# Patient Record
Sex: Female | Born: 1985
Health system: Southern US, Community
[De-identification: ages and names within clinical notes are randomized; demographics above are authoritative.]

## PROBLEM LIST (undated history)

## (undated) DIAGNOSIS — I959 Hypotension, unspecified: Secondary | ICD-10-CM

## (undated) DIAGNOSIS — M5137 Other intervertebral disc degeneration, lumbosacral region: Secondary | ICD-10-CM

## (undated) DIAGNOSIS — M51379 Other intervertebral disc degeneration, lumbosacral region without mention of lumbar back pain or lower extremity pain: Secondary | ICD-10-CM

## (undated) DIAGNOSIS — M5410 Radiculopathy, site unspecified: Secondary | ICD-10-CM

## (undated) HISTORY — DX: Radiculopathy, site unspecified: M54.10

## (undated) HISTORY — PX: TONSILLECTOMY: SUR1361

## (undated) HISTORY — PX: OTHER SURGICAL HISTORY: SHX169

## (undated) HISTORY — DX: Other intervertebral disc degeneration, lumbosacral region without mention of lumbar back pain or lower extremity pain: M51.379

## (undated) HISTORY — DX: Other intervertebral disc degeneration, lumbosacral region: M51.37

---

## 2012-02-04 ENCOUNTER — Encounter: Payer: Self-pay | Admitting: Family Medicine

## 2012-02-04 ENCOUNTER — Ambulatory Visit (INDEPENDENT_AMBULATORY_CARE_PROVIDER_SITE_OTHER): Payer: 59 | Admitting: Family Medicine

## 2012-02-04 VITALS — BP 105/64 | HR 89

## 2012-02-04 DIAGNOSIS — H60399 Other infective otitis externa, unspecified ear: Secondary | ICD-10-CM

## 2012-02-04 DIAGNOSIS — H6090 Unspecified otitis externa, unspecified ear: Secondary | ICD-10-CM

## 2012-02-04 MED ORDER — CIPROFLOXACIN-DEXAMETHASONE 0.3-0.1 % OT SUSP: 4.0000 [drp] | Freq: Two times a day (BID) | OTIC | Status: DC

## 2012-02-04 MED ORDER — SULFAMETHOXAZOLE-TRIMETHOPRIM 800-160 MG PO TABS: ORAL_TABLET | ORAL | Status: DC

## 2012-02-04 NOTE — Progress Notes (Signed)
CC: Kimberly Knight is a 26 y.o. female is here for Mass   Subjective: HPI:  Patient presents to establish care past medical history significant for Pap smear August 2011 without abnormalities. Tetanus in 2012. Surgical history significant for hemangioma removal 1995  Patient concerned of right ear pain is been present since mid last week. The past 3 days she's been using acetic acid/hydrocortisone otic without much improvement of the pain. Pain is nonradiating, worse when wearing her stethoscope, nothing else makes it better or worse. No discharge from the ear, hearing loss, nor dizziness  Review of Systems - General ROS: negative for - chills, fever, night sweats, weight gain or weight loss Ophthalmic ROS: negative for - decreased vision Psychological ROS: negative for - anxiety or depression ENT ROS: negative for - hearing change, nasal congestion, tinnitus or allergies Hematological and Lymphatic ROS: negative for - bleeding problems, bruising or swollen lymph nodes Breast ROS: negative Respiratory ROS: no cough, shortness of breath, or wheezing Cardiovascular ROS: no chest pain or dyspnea on exertion Gastrointestinal ROS: no abdominal pain, change in bowel habits, or black or bloody stools Genito-Urinary ROS: negative for - genital discharge, genital ulcers, incontinence or abnormal bleeding from genitals Musculoskeletal ROS: negative for - joint pain or muscle pain Neurological ROS: negative for - headaches or memory loss Dermatological ROS: negative for lumps, mole changes, rash and skin lesion changes  History reviewed. No pertinent past medical history.   Family History  Problem Relation Age of Onset  . Cancer Mother     breast, skin  . Cancer Father     Pancreatic   . Hypertension Father      History  Substance Use Topics  . Smoking status: Never Smoker   . Smokeless tobacco: Never Used  . Alcohol Use: Yes     Comment: rare     Objective: Filed Vitals:   02/04/12  1400  BP: 105/64  Pulse: 89    General: Alert and Oriented, No Acute Distress HEENT: Pupils equal, round, reactive to light. Conjunctivae clear.  There is a half centimeter dome-shaped tender erythematous mass around 6:00 of the distal external canal on her right, TMs with appropriate landmarks.  Middle ear appears open without effusion. Pink inferior turbinates.  Moist mucous membranes, pharynx without inflammation nor lesions.  Neck supple without palpable lymphadenopathy nor abnormal masses. Lungs: Comfortable work of breathing, no accessory muscle use  Skin: Warm and dry.  Assessment & Plan: Kimberly Knight was seen today for mass.  Diagnoses and associated orders for this visit:  Otitis externa - ciprofloxacin-dexamethasone (CIPRODEX) otic suspension; Place 4 drops into the right ear 2 (two) times daily. - sulfamethoxazole-trimethoprim (SEPTRA DS) 800-160 MG per tablet; One by mouth twice a day for ten days.     Return if symptoms worsen or fail to improve.

## 2012-02-22 ENCOUNTER — Telehealth: Payer: Self-pay | Admitting: Family Medicine

## 2012-02-22 DIAGNOSIS — H9209 Otalgia, unspecified ear: Secondary | ICD-10-CM

## 2012-02-22 NOTE — Telephone Encounter (Signed)
Per patient request

## 2012-07-10 ENCOUNTER — Telehealth: Payer: Self-pay | Admitting: Family Medicine

## 2012-07-10 ENCOUNTER — Ambulatory Visit (INDEPENDENT_AMBULATORY_CARE_PROVIDER_SITE_OTHER): Payer: 59 | Admitting: Family Medicine

## 2012-07-10 ENCOUNTER — Encounter: Payer: Self-pay | Admitting: Family Medicine

## 2012-07-10 VITALS — BP 99/77 | Ht 73.0 in | Wt 177.0 lb

## 2012-07-10 DIAGNOSIS — Z130 Encounter for screening for diseases of the blood and blood-forming organs and certain disorders involving the immune mechanism: Secondary | ICD-10-CM

## 2012-07-10 DIAGNOSIS — Z13220 Encounter for screening for lipoid disorders: Secondary | ICD-10-CM

## 2012-07-10 DIAGNOSIS — N920 Excessive and frequent menstruation with regular cycle: Secondary | ICD-10-CM

## 2012-07-10 DIAGNOSIS — Z131 Encounter for screening for diabetes mellitus: Secondary | ICD-10-CM

## 2012-07-10 DIAGNOSIS — J029 Acute pharyngitis, unspecified: Secondary | ICD-10-CM

## 2012-07-10 MED ORDER — PENICILLIN V POTASSIUM 500 MG PO TABS: ORAL_TABLET | ORAL | Status: DC

## 2012-07-10 NOTE — Telephone Encounter (Signed)
Leaving for [employer] , daughter iwht strep and now having some sore throat, will take Rx incase symptoms progress out of country.

## 2012-07-10 NOTE — Progress Notes (Signed)
CC: Kimberly Knight is a 27 y.o. female is here for Hyperlipidemia   Subjective: HPI:  Patient presents to discuss routine blood work.  She reports a history of heavy menses prior and since delivery of her-31-year-old daughter. Denies swollen lymph nodes, bruising, night sweats, shortness of breath or fatigue.  Reports prior history of hair falling out at that time TSH was normal. Currently denies fatigue or signs of hyper or hypothyroidism. Hair loss resolved on its own. No unintentional weight gain or loss  Has never had cholesterol screening. Tries to eat healthy diet, regular exercise regimen training for half marathon. She's a nonsmoker no history elevated blood pressures.  Has never been screened for diabetes or renal insufficiency.    Review Of Systems Outlined In HPI  History reviewed. No pertinent past medical history.   Family History  Problem Relation Age of Onset  . Cancer Mother     breast, skin  . Cancer Father     Pancreatic   . Hypertension Father      History  Substance Use Topics  . Smoking status: Never Smoker   . Smokeless tobacco: Never Used  . Alcohol Use: Yes     Comment: rare     Objective: Filed Vitals:   07/10/12 0812  BP: 99/77    Vital signs reviewed. General: Alert and Oriented, No Acute Distress HEENT: Pupils equal, round, reactive to light. Conjunctivae clear.  External ears unremarkable.  Moist mucous membranes. Lungs: Clear and comfortable work of breathing, speaking in full sentences without accessory muscle use. Cardiac: Regular rate and rhythm.  Neuro: CN II-XII grossly intact, gait normal. Extremities: No peripheral edema.  Strong peripheral pulses.  Mental Status: No depression, anxiety, nor agitation. Logical though process. Skin: Warm and dry.  Assessment & Plan: Satonya was seen today for hyperlipidemia.  Diagnoses and associated orders for this visit:  Lipid screening - Lipid panel  Screening for diabetes  mellitus - COMPLETE METABOLIC PANEL WITH GFR  Screening for deficiency anemia - CBC  Heavy menses - CBC    Discussed [place] PTF guidelines with routine screening labs above will add CBC due to history of heavy menses to rule out anemia or thrombocytopenia.  Patient will be leaving for [place] on Friday, requesting results to be left in her office.  15 minutes spent face-to-face during visit today of which at least 50% was counseling or coordinating care lipid screening, screen diabetes, screen deficiency anemia, heavy menses    Return if symptoms worsen or fail to improve.

## 2012-07-11 ENCOUNTER — Encounter: Payer: Self-pay | Admitting: Family Medicine

## 2012-07-11 LAB — COMPLETE METABOLIC PANEL WITH GFR
ALT: 8 U/L (ref 0–35)
Alkaline Phosphatase: 50 U/L (ref 39–117)
CO2: 25 mEq/L (ref 19–32)
Creat: 0.8 mg/dL (ref 0.50–1.10)
GFR, Est African American: >89 mL/min
GFR, Est Non African American: >89 mL/min
Glucose, Bld: 79 mg/dL (ref 70–99)
Sodium: 139 mEq/L (ref 135–145)
Total Bilirubin: 0.7 mg/dL (ref 0.3–1.2)
Total Protein: 7.3 g/dL (ref 6.0–8.3)

## 2012-07-11 LAB — CBC
HCT: 38.8 % (ref 36.0–46.0)
Hemoglobin: 13.2 g/dL (ref 12.0–15.0)
RBC: 4.48 MIL/uL (ref 3.87–5.11)

## 2012-07-11 LAB — LIPID PANEL
HDL: 47 mg/dL (ref >39)
LDL Cholesterol: 95 mg/dL (ref 0–99)
Total CHOL/HDL Ratio: 3.6 Ratio
Triglycerides: 142 mg/dL (ref <150)

## 2012-10-14 ENCOUNTER — Encounter: Payer: Self-pay | Admitting: Family Medicine

## 2012-10-14 ENCOUNTER — Ambulatory Visit (INDEPENDENT_AMBULATORY_CARE_PROVIDER_SITE_OTHER): Payer: 59 | Admitting: Family Medicine

## 2012-10-14 VITALS — BP 107/76 | HR 83 | Wt 173.0 lb

## 2012-10-14 DIAGNOSIS — IMO0002 Reserved for concepts with insufficient information to code with codable children: Secondary | ICD-10-CM

## 2012-10-14 DIAGNOSIS — S76011A Strain of muscle, fascia and tendon of right hip, initial encounter: Secondary | ICD-10-CM

## 2012-10-14 MED ORDER — CYCLOBENZAPRINE HCL 10 MG PO TABS: ORAL_TABLET | ORAL | Status: DC

## 2012-10-14 MED ORDER — KETOROLAC TROMETHAMINE 60 MG/2ML IM SOLN
60.0000 mg | Freq: Once | INTRAMUSCULAR | Status: AC
  Administered 2012-10-14: 60 mg via INTRAMUSCULAR

## 2012-10-14 NOTE — Progress Notes (Signed)
CC: Kimberly Knight is a 27 y.o. female is here for Back Pain   Subjective: HPI:  Patient is a right low back pain that is localized to above the right buttock that radiates within the right buttock and just slightly through the proximal right hamstrings. Symptoms came on abruptly of moderate severity while playing basketball at her house after landing on her on her right foot with her leg in an extended position.  She continued to play however within minutes symptoms became severe and she had to sit down. She reports inability to stand up for 10-15 minutes due to severe reproduction of the pain however it was relieved to some degree by leaning forward or sitting down. She required the assistance of her husband with sitting up and walking, pain was then somewhat improved with massage, 2 Aleve and 20 minutes of direct heat in her shower. Symptoms have significantly improved however still moderate in severity. Pain is again reproduced with standing completely erect or with bearing all her weight on her right foot. Symptoms again are improved with leaning forward while sitting. She denies midline back pain, saddle paresthesia, or weakness in either extremity. She denies any history of back pain in the past.  No genitourinary complaints nor bowel complaints   Review Of Systems Outlined In HPI  No past medical history on file.   Family History  Problem Relation Age of Onset  . Cancer Mother     breast, skin  . Cancer Father     Pancreatic   . Hypertension Father      History  Substance Use Topics  . Smoking status: Never Smoker   . Smokeless tobacco: Never Used  . Alcohol Use: Yes     Comment: rare     Objective: Filed Vitals:   10/14/12 1338  BP: 107/76  Pulse: 83    General: Alert and Oriented, No Acute Distress HEENT: Pupils equal, round, reactive to light. Conjunctivae clear.   Lungs: Clear comfortable work of breathing Extremities: No peripheral edema.  Strong peripheral pulses.  Negative straight leg raise negative fadir/fader, negative femoral grind, negative log roll, she has full range of motion strength in the right lower extremity. L4 and S1 DTRs are two over four bilaterally with downgoing Babinski. Stork test is negative Back: No midline spinous process tenderness pain is reproduced palpating upper gluteus maximus on the right and SI joint  Mental Status: No depression, anxiety, nor agitation. Skin: Warm and dry.  Assessment & Plan: Kimberly Knight was seen today for back pain.  Diagnoses and associated orders for this visit:  Muscle strain of gluteal region, right, initial encounter - cyclobenzaprine (FLEXERIL) 10 MG tablet; Take a half to a full tab every 8-12 hours only as needed for muscle spasm, may cause sedation.    Discussed with patient my suspicion of gluteal strain along with SI joint ligamentous sprain, conservative treatment with relative rest Flexeril and as needed Aleve, received Toradol shot here today  Return if symptoms worsen or fail to improve.

## 2013-01-21 ENCOUNTER — Ambulatory Visit (INDEPENDENT_AMBULATORY_CARE_PROVIDER_SITE_OTHER): Payer: 59 | Admitting: Family Medicine

## 2013-01-21 ENCOUNTER — Encounter: Payer: Self-pay | Admitting: Family Medicine

## 2013-01-21 VITALS — BP 112/74 | HR 68 | Ht 73.0 in | Wt 177.0 lb

## 2013-01-21 DIAGNOSIS — Z0000 Encounter for general adult medical examination without abnormal findings: Secondary | ICD-10-CM

## 2013-01-21 NOTE — Patient Instructions (Signed)
Dr. Adamarys Shall's General Advice Following Your Complete Physical Exam  The Benefits of Regular Exercise: Unless you suffer from an uncontrolled cardiovascular condition, studies strongly suggest that regular exercise and physical activity will add to both the quality and length of your life.  The World Health Organization recommends 150 minutes of moderate intensity aerobic activity every week.  This is best split over 3-4 days a week, and can be as simple as a brisk walk for just over 35 minutes "most days of the week".  This type of exercise has been shown to lower LDL-Cholesterol, lower average blood sugars, lower blood pressure, lower cardiovascular disease risk, improve memory, and increase one's overall sense of wellbeing.  The addition of anaerobic (or "strength training") exercises offers additional benefits including but not limited to increased metabolism, prevention of osteoporosis, and improved overall cholesterol levels.  How Can I Strive For A Low-Fat Diet?: Current guidelines recommend that 25-35 percent of your daily energy (food) intake should come from fats.  One might ask how can this be achieved without having to dissect each meal on a daily basis?  Switch to skim or 1% milk instead of whole milk.  Focus on lean meats such as ground turkey, fresh fish, baked chicken, and lean cuts of beef as your source of dietary protein.  Consume less than 300mg/day of dietary cholesterol.  Limit trans fatty acid consumption primarily by limiting synthetic trans fats such as partially hydrogenated oils (Ex: fried fast foods).  Focus efforts on reducing your intake of "solid" fats (Ex: Butter).  Substitute olive or vegetable oil for solid fats where possible.  Moderation of Salt Intake: Provided you don't carry a diagnosis of congestive heart failure nor renal failure, I recommend a daily allowance of no more than 2300 mg of salt (sodium).  Keeping under this daily goal is associated with a  decreased risk of cardiovascular events, creeping above it can lead to elevated blood pressures and increases your risk of cardiovascular events.  Milligrams (mg) of salt is listed on all nutrition labels, and your daily intake can add up faster than you think.  Most canned and frozen dinners can pack in over half your daily salt allowance in one meal.    Lifestyle Health Risks: Certain lifestyle choices carry specific health risks.  As you may already know, tobacco use has been associated with increasing one's risk of cardiovascular disease, pulmonary disease, numerous cancers, among many other issues.  What you may not know is that there are medications and nicotine replacement strategies that can more than double your chances of successfully quitting.  I would be thrilled to help manage your quitting strategy if you currently use tobacco products.  When it comes to alcohol use, I've yet to find an "ideal" daily allowance.  Provided an individual does not have a medical condition that is exacerbated by alcohol consumption, general guidelines determine "safe drinking" as no more than two standard drinks for a man or no more than one standard drink for a female per day.  However, much debate still exists on whether any amount of alcohol consumption is technically "safe".  My general advice, keep alcohol consumption to a minimum for general health promotion.  If you or others believe that alcohol, tobacco, or recreational drug use is interfering with your life, I would be happy to provide confidential counseling regarding treatment options.  General "Over The Counter" Nutrition Advice: Postmenopausal women should aim for a daily calcium intake of 1200 mg, however a significant   portion of this might already be provided by diets including milk, yogurt, cheese, and other dairy products.  Vitamin D has been shown to help preserve bone density, prevent fatigue, and has even been shown to help reduce falls in the  elderly.  Ensuring a daily intake of 800 Units of Vitamin D is a good place to start to enjoy the above benefits, we can easily check your Vitamin D level to see if you'd potentially benefit from supplementation beyond 800 Units a day.  Folic Acid intake should be of particular concern to women of childbearing age.  Daily consumption of 400-800 mcg of Folic Acid is recommended to minimize the chance of spinal cord defects in a fetus should pregnancy occur.    For many adults, accidents still remain one of the most common culprits when it comes to cause of death.  Some of the simplest but most effective preventitive habits you can adopt include regular seatbelt use, proper helmet use, securing firearms, and regularly testing your smoke and carbon monoxide detectors.  Graiden [facility] DO [facility] [place] [address], [address] [place], [place] [address] Phone: [phone]  Breast Self-Awareness Practicing breast self-awareness may pick up problems early, prevent significant medical complications, and possibly save your life. By practicing breast self-awareness, you can become familiar with how your breasts look and feel and if your breasts are changing. This allows you to notice changes early. It can also offer you some reassurance that your breast health is good. One way to learn what is normal for your breasts and whether your breasts are changing is to do a breast self-exam. If you find a lump or something that was not present in the past, it is best to contact your caregiver right away. Other findings that should be evaluated by your caregiver include nipple discharge, especially if it is bloody; skin changes or reddening; areas where the skin seems to be pulled in (retracted); or new lumps and bumps. Breast pain is seldom associated with cancer (malignancy), but should also be evaluated by a caregiver. HOW TO PERFORM A BREAST SELF-EXAM The best time to examine your breasts is 5 7 days after  your menstrual period is over. During menstruation, the breasts are lumpier, and it may be more difficult to pick up changes. If you do not menstruate, have reached menopause, or had your uterus removed (hysterectomy), you should examine your breasts at regular intervals, such as monthly. If you are breastfeeding, examine your breasts after a feeding or after using a breast pump. Breast implants do not decrease the risk for lumps or tumors, so continue to perform breast self-exams as recommended. Talk to your caregiver about how to determine the difference between the implant and breast tissue. Also, talk about the amount of pressure you should use during the exam. Over time, you will become more familiar with the variations of your breasts and more comfortable with the exam. A breast self-exam requires you to remove all your clothes above the waist. 1. Look at your breasts and nipples. Stand in front of a mirror in a room with good lighting. With your hands on your hips, push your hands firmly downward. Look for a difference in shape, contour, and size from one breast to the other (asymmetry). Asymmetry includes puckers, dips, or bumps. Also, look for skin changes, such as reddened or scaly areas on the breasts. Look for nipple changes, such as discharge, dimpling, repositioning, or redness. 2. Carefully feel your breasts. This is  best done either in the shower or tub while using soapy water or when flat on your back. Place the arm (on the side of the breast you are examining) above your head. Use the pads (not the fingertips) of your three middle fingers on your opposite hand to feel your breasts. Start in the underarm area and use  inch (2 cm) overlapping circles to feel your breast. Use 3 different levels of pressure (light, medium, and firm pressure) at each circle before moving to the next circle. The light pressure is needed to feel the tissue closest to the skin. The medium pressure will help to feel  breast tissue a little deeper, while the firm pressure is needed to feel the tissue close to the ribs. Continue the overlapping circles, moving downward over the breast until you feel your ribs below your breast. Then, move one finger-width towards the center of the body. Continue to use the  inch (2 cm) overlapping circles to feel your breast as you move slowly up toward the collar bone (clavicle) near the base of the neck. Continue the up and down exam using all 3 pressures until you reach the middle of the chest. Do this with each breast, carefully feeling for lumps or changes. 3.  Keep a written record with breast changes or normal findings for each breast. By writing this information down, you do not need to depend only on memory for size, tenderness, or location. Write down where you are in your menstrual cycle, if you are still menstruating. Breast tissue can have some lumps or thick tissue. However, see your caregiver if you find anything that concerns you.  SEEK MEDICAL CARE IF:  You see a change in shape, contour, or size of your breasts or nipples.   You see skin changes, such as reddened or scaly areas on the breasts or nipples.   You have an unusual discharge from your nipples.   You feel a new lump or unusually thick areas.  Document Released: 02/12/2005 Document Revised: 01/30/2012 Document Reviewed: 05/30/2011 [facility] Patient Information 2014 [place], [place].

## 2013-01-21 NOTE — Progress Notes (Signed)
CC: Kimberly Knight is a 27 y.o. female is here for Annual Exam   Subjective: HPI:  Colonoscopy: No family history will begin routine screening age 25 Papsmear: Normal two years ago, due for repeat in one year Mammogram: Mother at age 59, BRCA negative, will be due for mammograms age 41  Influenza Vaccine: UTD from October Pneumovax: No current indication Td/Tdap: UTD from 2012 Zoster: (Start 29 yo)  No acute complaints today  Over the past 2 weeks have you been bothered by: - Little interest or pleasure in doing things: No - Feeling down depressed or hopeless: No  Occasional exercise, rare alcohol use no tobacco or recreational drug use.  Review of Systems - General ROS: negative for - chills, fever, night sweats, weight gain or weight loss Ophthalmic ROS: negative for - decreased vision Psychological ROS: negative for - anxiety or depression ENT ROS: negative for - hearing change, nasal congestion, tinnitus or allergies Hematological and Lymphatic ROS: negative for - bleeding problems, bruising or swollen lymph nodes Breast ROS: negative Respiratory ROS: no cough, shortness of breath, or wheezing Cardiovascular ROS: no chest pain or dyspnea on exertion Gastrointestinal ROS: no abdominal pain, change in bowel habits, or black or bloody stools Genito-Urinary ROS: negative for - genital discharge, genital ulcers, incontinence or abnormal bleeding from genitals Musculoskeletal ROS: negative for - joint pain or muscle pain Neurological ROS: negative for - headaches or memory loss Dermatological ROS: negative for lumps, mole changes, rash and skin lesion changes  History reviewed. No pertinent past medical history.   Family History  Problem Relation Age of Onset  . Cancer Mother     breast, skin  . Cancer Father     Pancreatic   . Hypertension Father      History  Substance Use Topics  . Smoking status: Never Smoker   . Smokeless tobacco: Never Used  . Alcohol Use: Yes      Comment: rare     Objective: Filed Vitals:   01/21/13 1605  BP: 112/74  Pulse: 68    General: No Acute Distress HEENT: Atraumatic, normocephalic, conjunctivae normal without scleral icterus.  No nasal discharge, hearing grossly intact, TMs with good landmarks bilaterally with no middle ear abnormalities, posterior pharynx clear without oral lesions. Neck: Supple, trachea midline, no cervical nor supraclavicular adenopathy. Pulmonary: Clear to auscultation bilaterally without wheezing, rhonchi, nor rales. Cardiac: Regular rate and rhythm.  No murmurs, rubs, nor gallops. No peripheral edema.  2+ peripheral pulses bilaterally. Abdomen: Bowel sounds normal.  No masses.  Non-tender without rebound.  Negative Murphy's sign. MSK: Grossly intact, no signs of weakness.  Full strength throughout upper and lower extremities.  Full ROM in upper and lower extremities.  No midline spinal tenderness. Neuro: Gait unremarkable, CN II-XII grossly intact.  C5-C6 Reflex 2/4 Bilaterally, L4 Reflex 2/4 Bilaterally.  Cerebellar function intact. Skin: No rashes. Wart left elbow Psych: Alert and oriented to person/place/time.  Thought process normal. No anxiety/depression.  Assessment & Plan: Kimberly Knight was seen today for annual exam.  Diagnoses and associated orders for this visit:  Annual physical exam    Healthy lifestyle interventions including but limited to regular exercise, a healthy low fat diet, moderation of salt intake, the dangers of tobacco/alcohol/recreational drug use, nutrition supplementation, and accident avoidance were discussed with the patient and a handout was provided for future reference.  Return in about 1 year (around 01/21/2014).

## 2013-01-26 ENCOUNTER — Encounter: Payer: 59 | Admitting: Family Medicine

## 2013-06-05 ENCOUNTER — Telehealth: Payer: Self-pay | Admitting: Family Medicine

## 2013-06-05 MED ORDER — TRIAMCINOLONE ACETONIDE 0.5 % EX CREA: 1.0000 "application " | TOPICAL_CREAM | Freq: Every day | CUTANEOUS | Status: DC | PRN

## 2013-06-05 NOTE — Telephone Encounter (Signed)
Patient came in today with a rash on her upper chest. Excessive sun exposure over the weekend. She now has fine erythematous papules and itching. Will send over a topical steroid cream for acute relief. Followup if not improving.

## 2013-08-06 ENCOUNTER — Encounter: Payer: Self-pay | Admitting: Obstetrics & Gynecology

## 2013-08-06 ENCOUNTER — Ambulatory Visit (INDEPENDENT_AMBULATORY_CARE_PROVIDER_SITE_OTHER): Payer: 59 | Admitting: Obstetrics & Gynecology

## 2013-08-06 VITALS — BP 123/78 | HR 93 | Resp 16 | Ht 73.0 in | Wt 177.0 lb

## 2013-08-06 DIAGNOSIS — Z124 Encounter for screening for malignant neoplasm of cervix: Secondary | ICD-10-CM

## 2013-08-06 DIAGNOSIS — Z30432 Encounter for removal of intrauterine contraceptive device: Secondary | ICD-10-CM

## 2013-08-06 DIAGNOSIS — Z0000 Encounter for general adult medical examination without abnormal findings: Secondary | ICD-10-CM

## 2013-08-06 DIAGNOSIS — Z01419 Encounter for gynecological examination (general) (routine) without abnormal findings: Secondary | ICD-10-CM

## 2013-08-06 NOTE — Progress Notes (Signed)
Subjective:    Kimberly Knight is a 28 y.o. female who presents for an annual exam. She would like her Paragard IUD removed as she would like another ("final") pregnancy. She has been on PNVs for a couple of months.  The patient has no complaints today. The patient is sexually active. GYN screening history: last pap: was normal. The patient wears seatbelts: yes. The patient participates in regular exercise: yes. Has the patient ever been transfused or tattooed?: no. The patient reports that there is not domestic violence in her life.   Menstrual History: OB History   Grav Para Term Preterm Abortions TAB SAB Ect Mult Living                  Menarche age: 32  Patient's last menstrual period was 07/10/2013.    The following portions of the patient's history were reviewed and updated as appropriate: allergies, current medications, past family history, past medical history, past social history, past surgical history and problem list.  Review of Systems A comprehensive review of systems was negative. Married for 9 years, denies dyspareunia, periods about 5 days and heavy, monthly.   Objective:    BP 123/78  Pulse 93  Resp 16  Ht 6\' 1"  (1.854 m)  Wt 177 lb (80.287 kg)  BMI 23.36 kg/m2  LMP 07/10/2013  General Appearance:    Alert, cooperative, no distress, appears stated age  Head:    Normocephalic, without obvious abnormality, atraumatic  Eyes:    PERRL, conjunctiva/corneas clear, EOM's intact, fundi    benign, both eyes  Ears:    Normal TM's and external ear canals, both ears  Nose:   Nares normal, septum midline, mucosa normal, no drainage    or sinus tenderness  Throat:   Lips, mucosa, and tongue normal; teeth and gums normal  Neck:   Supple, symmetrical, trachea midline, no adenopathy;    thyroid:  no enlargement/tenderness/nodules; no carotid   bruit or JVD  Back:     Symmetric, no curvature, ROM normal, no CVA tenderness  Lungs:     Clear to auscultation bilaterally,  respirations unlabored  Chest Wall:    No tenderness or deformity   Heart:    Regular rate and rhythm, S1 and S2 normal, no murmur, rub   or gallop  Breast Exam:    No tenderness, masses, or nipple abnormality  Abdomen:     Soft, non-tender, bowel sounds active all four quadrants,    no masses, no organomegaly  Genitalia:    Normal female without lesion, discharge or tenderness, NSSA, NT, mobile, normal adnexal exam. Paragard IUD easily removed and noted to be intact.     Extremities:   Extremities normal, atraumatic, no cyanosis or edema  Pulses:   2+ and symmetric all extremities  Skin:   Skin color, texture, turgor normal, no rashes or lesions  Lymph nodes:   Cervical, supraclavicular, and axillary nodes normal  Neurologic:   CNII-XII intact, normal strength, sensation and reflexes    throughout  .    Assessment:    Healthy female exam.    Plan:     Breast self exam technique reviewed and patient encouraged to perform self-exam monthly. Thin prep Pap smear.  IUD removed

## 2013-08-10 LAB — CYTOLOGY - PAP

## 2013-08-12 ENCOUNTER — Telehealth: Payer: Self-pay | Admitting: *Deleted

## 2013-08-12 NOTE — Telephone Encounter (Signed)
Pt notified of normal pap results. 

## 2013-11-30 ENCOUNTER — Ambulatory Visit (INDEPENDENT_AMBULATORY_CARE_PROVIDER_SITE_OTHER): Payer: 59 | Admitting: Advanced Practice Midwife

## 2013-11-30 ENCOUNTER — Encounter: Payer: Self-pay | Admitting: Advanced Practice Midwife

## 2013-11-30 VITALS — BP 130/85 | HR 97 | Wt 187.0 lb

## 2013-11-30 DIAGNOSIS — Z3491 Encounter for supervision of normal pregnancy, unspecified, first trimester: Secondary | ICD-10-CM

## 2013-11-30 DIAGNOSIS — N898 Other specified noninflammatory disorders of vagina: Secondary | ICD-10-CM

## 2013-11-30 DIAGNOSIS — Z3481 Encounter for supervision of other normal pregnancy, first trimester: Secondary | ICD-10-CM

## 2013-11-30 DIAGNOSIS — N9489 Other specified conditions associated with female genital organs and menstrual cycle: Secondary | ICD-10-CM

## 2013-11-30 DIAGNOSIS — O219 Vomiting of pregnancy, unspecified: Secondary | ICD-10-CM

## 2013-11-30 MED ORDER — DICLEGIS 10-10 MG PO TBEC: 2.0000 | DELAYED_RELEASE_TABLET | ORAL | Status: DC

## 2013-11-30 MED ORDER — METOCLOPRAMIDE HCL 10 MG PO TABS: 10.0000 mg | ORAL_TABLET | Freq: Four times a day (QID) | ORAL | Status: DC | PRN

## 2013-11-30 NOTE — Progress Notes (Signed)
   Subjective:    Kimberly Knight is a G3P1011 [redacted]w[redacted]d by LMP, verified by [place] today being seen today for her first obstetrical visit.  Her obstetrical history is significant for macrosomia. No shoulder dystocia.  Patient does intend to breast feed. Pregnancy history fully reviewed.  Patient reports nausea, vomiting and vaginal discharge w/ odor.  Filed Vitals:   11/30/13 0809  BP: 130/85  Pulse: 97  Weight: 187 lb (84.823 kg)    HISTORY: OB History  Gravida Para Term Preterm AB SAB TAB Ectopic Multiple Living  3 1 1  1 1    1     # Outcome Date GA Lbr Len/2nd Weight Sex Delivery Anes PTL Lv  3 CUR           2 TRM 09/01/10 [redacted]w[redacted]d  10 lb 8 oz (4.763 kg) F SVD EPI N Y     Comments: induction  1 SAB              History reviewed. No pertinent past medical history. Past Surgical History  Procedure Laterality Date  . Hemangioma removal 1995     Family History  Problem Relation Age of Onset  . Cancer Mother     breast, skin  . Cancer Father     Pancreatic   . Hypertension Father      Exam    Uterus:     Pelvic Exam:    Perineum: No Hemorrhoids   Vulva: normal   Vagina:  normal mucosa, wet prep done, mod creamy, white, mildly malodorous discharge   pH: NA   Cervix: multiparous appearance, no bleeding following Pap and ectropion   Adnexa: normal adnexa and no mass, fullness, tenderness   Bony Pelvis: gynecoid and proven to 10-6  System: Breast:  normal appearance, no masses or tenderness, ***   Skin: Declined    Neurologic: oriented, grossly non-focal   Extremities: normal strength, tone, and muscle mass   HEENT sclera clear, anicteric   Mouth/Teeth mucous membranes moist, pharynx normal without lesions and dental hygiene good   Neck supple and no masses   Cardiovascular: regular rate and rhythm, no murmurs or gallops   Respiratory:  neck free of mass or lymphadenopathy, chest clear, no wheezing, crepitations, rhonchi, normal symmetric air entry   Abdomen: soft,  non-tender; bowel sounds normal; no masses,  no organomegaly and Well-healed scar upper abd   Urinary: urethral meatus normal      Assessment:    Pregnancy: G3P1011 1. Normal pregnancy in first trimester *** - Prenatal (OB Panel) - HIV antibody (with reflex) - CULTURE, URINE COMPREHENSIVE - GC/Chlamydia Probe Amp  2. Supervision of normal pregnancy in first trimester ***  3. Vaginal odor *** - WET PREP FOR TRICH, YEAST, CLUE  4. Nausea and vomiting of pregnancy, antepartum *** - metoCLOPramide (REGLAN) 10 MG tablet; Take 1 tablet (10 mg total) by mouth every 6 (six) hours as needed for nausea.  Dispense: 60 tablet; Refill: 3 - DICLEGIS 10-10 MG TBEC; Take 2 tablets by mouth as directed.  Dispense: 100 tablet; Refill: 1       Plan:     Initial labs drawn. Prenatal vitamins. Problem list reviewed and updated. Genetic Screening discussed First Screen and Quad Screen: declined.  Ultrasound discussed; fetal survey: requested.  Follow up in 4 weeks. 50% of 30 min visit spent on counseling and coordination of care.  Needs early 1 hour GTT due to Hx macrosomia   Latresa Gasser 11/30/2013

## 2013-11-30 NOTE — Progress Notes (Signed)
Poss BV at this time.  Increase in nausea.  IUP with FHT and CRL 11.9 mm and GA [redacted]w[redacted]d

## 2013-11-30 NOTE — Patient Instructions (Signed)
First Trimester of Pregnancy The first trimester of pregnancy is from week 1 until the end of week 12 (months 1 through 3). A week after a sperm fertilizes an egg, the egg will implant on the wall of the uterus. This embryo will begin to develop into a baby. Genes from you and your partner are forming the baby. The female genes determine whether the baby is a boy or a girl. At 6-8 weeks, the eyes and face are formed, and the heartbeat can be seen on ultrasound. At the end of 12 weeks, all the baby's organs are formed.  Now that you are pregnant, you will want to do everything you can to have a healthy baby. Two of the most important things are to get good prenatal care and to follow your health care provider's instructions. Prenatal care is all the medical care you receive before the baby's birth. This care will help prevent, find, and treat any problems during the pregnancy and childbirth. BODY CHANGES Your body goes through many changes during pregnancy. The changes vary from woman to woman.   You may gain or lose a couple of pounds at first.  You may feel sick to your stomach (nauseous) and throw up (vomit). If the vomiting is uncontrollable, call your health care provider.  You may tire easily.  You may develop headaches that can be relieved by medicines approved by your health care provider.  You may urinate more often. Painful urination may mean you have a bladder infection.  You may develop heartburn as a result of your pregnancy.  You may develop constipation because certain hormones are causing the muscles that push waste through your intestines to slow down.  You may develop hemorrhoids or swollen, bulging veins (varicose veins).  Your breasts may begin to grow larger and become tender. Your nipples may stick out more, and the tissue that surrounds them (areola) may become darker.  Your gums may bleed and may be sensitive to brushing and flossing.  Dark spots or blotches (chloasma,  mask of pregnancy) may develop on your face. This will likely fade after the baby is born.  Your menstrual periods will stop.  You may have a loss of appetite.  You may develop cravings for certain kinds of food.  You may have changes in your emotions from day to day, such as being excited to be pregnant or being concerned that something may go wrong with the pregnancy and baby.  You may have more vivid and strange dreams.  You may have changes in your hair. These can include thickening of your hair, rapid growth, and changes in texture. Some women also have hair loss during or after pregnancy, or hair that feels dry or thin. Your hair will most likely return to normal after your baby is born. WHAT TO EXPECT AT YOUR PRENATAL VISITS During a routine prenatal visit:  You will be weighed to make sure you and the baby are growing normally.  Your blood pressure will be taken.  Your abdomen will be measured to track your baby's growth.  The fetal heartbeat will be listened to starting around week 10 or 12 of your pregnancy.  Test results from any previous visits will be discussed. Your health care provider may ask you:  How you are feeling.  If you are feeling the baby move.  If you have had any abnormal symptoms, such as leaking fluid, bleeding, severe headaches, or abdominal cramping.  If you have any questions. Other tests   that may be performed during your first trimester include:  Blood tests to find your blood type and to check for the presence of any previous infections. They will also be used to check for low iron levels (anemia) and Rh antibodies. Later in the pregnancy, blood tests for diabetes will be done along with other tests if problems develop.  Urine tests to check for infections, diabetes, or protein in the urine.  An ultrasound to confirm the proper growth and development of the baby.  An amniocentesis to check for possible genetic problems.  Fetal screens for  spina bifida and Down syndrome.  You may need other tests to make sure you and the baby are doing well. HOME CARE INSTRUCTIONS  Medicines  Follow your health care provider's instructions regarding medicine use. Specific medicines may be either safe or unsafe to take during pregnancy.  Take your prenatal vitamins as directed.  If you develop constipation, try taking a stool softener if your health care provider approves. Diet  Eat regular, well-balanced meals. Choose a variety of foods, such as meat or vegetable-based protein, fish, milk and low-fat dairy products, vegetables, fruits, and whole grain breads and cereals. Your health care provider will help you determine the amount of weight gain that is right for you.  Avoid raw meat and uncooked cheese. These carry germs that can cause birth defects in the baby.  Eating four or five small meals rather than three large meals a day may help relieve nausea and vomiting. If you start to feel nauseous, eating a few soda crackers can be helpful. Drinking liquids between meals instead of during meals also seems to help nausea and vomiting.  If you develop constipation, eat more high-fiber foods, such as fresh vegetables or fruit and whole grains. Drink enough fluids to keep your urine clear or pale yellow. Activity and Exercise  Exercise only as directed by your health care provider. Exercising will help you:  Control your weight.  Stay in shape.  Be prepared for labor and delivery.  Experiencing pain or cramping in the lower abdomen or low back is a good sign that you should stop exercising. Check with your health care provider before continuing normal exercises.  Try to avoid standing for long periods of time. Move your legs often if you must stand in one place for a long time.  Avoid heavy lifting.  Wear low-heeled shoes, and practice good posture.  You may continue to have sex unless your health care provider directs you  otherwise. Relief of Pain or Discomfort  Wear a good support bra for breast tenderness.   Take warm sitz baths to soothe any pain or discomfort caused by hemorrhoids. Use hemorrhoid cream if your health care provider approves.   Rest with your legs elevated if you have leg cramps or low back pain.  If you develop varicose veins in your legs, wear support hose. Elevate your feet for 15 minutes, 3-4 times a day. Limit salt in your diet. Prenatal Care  Schedule your prenatal visits by the twelfth week of pregnancy. They are usually scheduled monthly at first, then more often in the last 2 months before delivery.  Write down your questions. Take them to your prenatal visits.  Keep all your prenatal visits as directed by your health care provider. Safety  Wear your seat belt at all times when driving.  Make a list of emergency phone numbers, including numbers for family, friends, the hospital, and police and fire departments. General Tips    Ask your health care provider for a referral to a local prenatal education class. Begin classes no later than at the beginning of month 6 of your pregnancy.  Ask for help if you have counseling or nutritional needs during pregnancy. Your health care provider can offer advice or refer you to specialists for help with various needs.  Do not use hot tubs, steam rooms, or saunas.  Do not douche or use tampons or scented sanitary pads.  Do not cross your legs for long periods of time.  Avoid cat litter boxes and soil used by cats. These carry germs that can cause birth defects in the baby and possibly loss of the fetus by miscarriage or stillbirth.  Avoid all smoking, herbs, alcohol, and medicines not prescribed by your health care provider. Chemicals in these affect the formation and growth of the baby.  Schedule a dentist appointment. At home, brush your teeth with a soft toothbrush and be gentle when you floss. SEEK MEDICAL CARE IF:   You have  dizziness.  You have mild pelvic cramps, pelvic pressure, or nagging pain in the abdominal area.  You have persistent nausea, vomiting, or diarrhea.  You have a bad smelling vaginal discharge.  You have pain with urination.  You notice increased swelling in your face, hands, legs, or ankles. SEEK IMMEDIATE MEDICAL CARE IF:   You have a fever.  You are leaking fluid from your vagina.  You have spotting or bleeding from your vagina.  You have severe abdominal cramping or pain.  You have rapid weight gain or loss.  You vomit blood or material that looks like coffee grounds.  You are exposed to [place] measles and have never had them.  You are exposed to fifth disease or chickenpox.  You develop a severe headache.  You have shortness of breath.  You have any kind of trauma, such as from a fall or a car accident. Document Released: 02/06/2001 Document Revised: 06/29/2013 Document Reviewed: 12/23/2012 [facility] Patient Information 2015 [place], [place]. This information is not intended to replace advice given to you by your health care provider. Make sure you discuss any questions you have with your health care provider.  Morning Sickness Morning sickness is when you feel sick to your stomach (nauseous) during pregnancy. This nauseous feeling may or may not come with vomiting. It often occurs in the morning but can be a problem any time of day. Morning sickness is most common during the first trimester, but it may continue throughout pregnancy. While morning sickness is unpleasant, it is usually harmless unless you develop severe and continual vomiting (hyperemesis gravidarum). This condition requires more intense treatment.  CAUSES  The cause of morning sickness is not completely known but seems to be related to normal hormonal changes that occur in pregnancy. RISK FACTORS You are at greater risk if you:  Experienced nausea or vomiting before your pregnancy.  Had morning  sickness during a previous pregnancy.  Are pregnant with more than one baby, such as twins. TREATMENT  Do not use any medicines (prescription, over-the-counter, or herbal) for morning sickness without first talking to your health care provider. Your health care provider may prescribe or recommend:  Vitamin B6 supplements.  Anti-nausea medicines.  The herbal medicine ginger. HOME CARE INSTRUCTIONS   Only take over-the-counter or prescription medicines as directed by your health care provider.  Taking multivitamins before getting pregnant can prevent or decrease the severity of morning sickness in most women.  Eat a piece of dry  toast or unsalted crackers before getting out of bed in the morning.  Eat five or six small meals a day.  Eat dry and bland foods (rice, baked potato). Foods high in carbohydrates are often helpful.  Do not drink liquids with your meals. Drink liquids between meals.  Avoid greasy, fatty, and spicy foods.  Get someone to cook for you if the smell of any food causes nausea and vomiting.  If you feel nauseous after taking prenatal vitamins, take the vitamins at night or with a snack.  Snack on protein foods (nuts, yogurt, cheese) between meals if you are hungry.  Eat unsweetened gelatins for desserts.  Wearing an acupressure wristband (worn for sea sickness) may be helpful.  Acupuncture may be helpful.  Do not smoke.  Get a humidifier to keep the air in your house free of odors.  Get plenty of fresh air. SEEK MEDICAL CARE IF:   Your home remedies are not working, and you need medicine.  You feel dizzy or lightheaded.  You are losing weight. SEEK IMMEDIATE MEDICAL CARE IF:   You have persistent and uncontrolled nausea and vomiting.  You pass out (faint). MAKE SURE YOU:  Understand these instructions.  Will watch your condition.  Will get help right away if you are not doing well or get worse. Document Released: 04/05/2006 Document  Revised: 02/17/2013 Document Reviewed: 07/30/2012 [facility] Patient Information 2015 [place], [place]. This information is not intended to replace advice given to you by your health care provider. Make sure you discuss any questions you have with your health care provider.

## 2013-12-01 LAB — OBSTETRIC PANEL
Antibody Screen: NEGATIVE
BASOS ABS: 0 10*3/uL (ref 0.0–0.1)
Basophils Relative: 0 % (ref 0–1)
EOS PCT: 1 % (ref 0–5)
Eosinophils Absolute: 0.1 10*3/uL (ref 0.0–0.7)
HCT: 37.4 % (ref 36.0–46.0)
Hemoglobin: 12.7 g/dL (ref 12.0–15.0)
Hepatitis B Surface Ag: NEGATIVE
LYMPHS PCT: 24 % (ref 12–46)
Lymphs Abs: 1.7 10*3/uL (ref 0.7–4.0)
MCH: 30.4 pg (ref 26.0–34.0)
MCHC: 34 g/dL (ref 30.0–36.0)
MCV: 89.5 fL (ref 78.0–100.0)
Monocytes Absolute: 0.4 10*3/uL (ref 0.1–1.0)
Monocytes Relative: 5 % (ref 3–12)
Neutro Abs: 4.9 10*3/uL (ref 1.7–7.7)
Neutrophils Relative %: 70 % (ref 43–77)
Platelets: 233 10*3/uL (ref 150–400)
RBC: 4.18 MIL/uL (ref 3.87–5.11)
RDW: 13.1 % (ref 11.5–15.5)
RUBELLA: 7.78 {index} — AB (ref <0.90)
Rh Type: POSITIVE
WBC: 7 10*3/uL (ref 4.0–10.5)

## 2013-12-01 LAB — WET PREP, GENITAL
Clue Cells Wet Prep HPF POC: NONE SEEN
Trich, Wet Prep: NONE SEEN
WBC WET PREP: NONE SEEN
YEAST WET PREP: NONE SEEN

## 2013-12-01 LAB — HIV ANTIBODY (ROUTINE TESTING W REFLEX): HIV: NONREACTIVE

## 2013-12-03 ENCOUNTER — Telehealth: Payer: Self-pay | Admitting: *Deleted

## 2013-12-03 DIAGNOSIS — N390 Urinary tract infection, site not specified: Secondary | ICD-10-CM

## 2013-12-03 MED ORDER — AMOXICILLIN 500 MG PO CAPS: 500.0000 mg | ORAL_CAPSULE | Freq: Three times a day (TID) | ORAL | Status: DC

## 2013-12-03 NOTE — Telephone Encounter (Signed)
LM on voicemail of positive urine culture and RX was sent to [facility] in [place].

## 2013-12-04 LAB — GC/CHLAMYDIA PROBE AMP
CT Probe RNA: NEGATIVE
GC Probe RNA: NEGATIVE

## 2013-12-28 ENCOUNTER — Encounter: Payer: Self-pay | Admitting: Advanced Practice Midwife

## 2014-01-05 ENCOUNTER — Encounter (HOSPITAL_BASED_OUTPATIENT_CLINIC_OR_DEPARTMENT_OTHER): Payer: Self-pay

## 2014-01-05 ENCOUNTER — Emergency Department (HOSPITAL_BASED_OUTPATIENT_CLINIC_OR_DEPARTMENT_OTHER)
Admission: EM | Admit: 2014-01-05 | Discharge: 2014-01-05 | Disposition: A | Discharge status: Home / Self Care | Payer: 59 | Attending: Emergency Medicine | Admitting: Emergency Medicine

## 2014-01-05 DIAGNOSIS — O2342 Unspecified infection of urinary tract in pregnancy, second trimester: Secondary | ICD-10-CM | POA: Diagnosis not present

## 2014-01-05 DIAGNOSIS — O212 Late vomiting of pregnancy: Secondary | ICD-10-CM | POA: Diagnosis not present

## 2014-01-05 DIAGNOSIS — Z792 Long term (current) use of antibiotics: Secondary | ICD-10-CM | POA: Insufficient documentation

## 2014-01-05 DIAGNOSIS — N390 Urinary tract infection, site not specified: Secondary | ICD-10-CM

## 2014-01-05 DIAGNOSIS — Z3A22 22 weeks gestation of pregnancy: Secondary | ICD-10-CM | POA: Insufficient documentation

## 2014-01-05 DIAGNOSIS — O219 Vomiting of pregnancy, unspecified: Secondary | ICD-10-CM

## 2014-01-05 LAB — URINALYSIS, ROUTINE W REFLEX MICROSCOPIC
BILIRUBIN URINE: NEGATIVE
GLUCOSE, UA: NEGATIVE mg/dL
Hgb urine dipstick: NEGATIVE
KETONES UR: NEGATIVE mg/dL
Nitrite: NEGATIVE
Protein, ur: NEGATIVE mg/dL
Specific Gravity, Urine: 1.017 (ref 1.005–1.030)
Urobilinogen, UA: 0.2 mg/dL (ref 0.0–1.0)
pH: 6.5 (ref 5.0–8.0)

## 2014-01-05 LAB — PREGNANCY, URINE: Preg Test, Ur: POSITIVE — AB

## 2014-01-05 LAB — URINE MICROSCOPIC-ADD ON

## 2014-01-05 MED ORDER — METOCLOPRAMIDE HCL 5 MG/ML IJ SOLN
10.0000 mg | Freq: Once | INTRAMUSCULAR | Status: AC
  Administered 2014-01-05: 10 mg via INTRAVENOUS
  Filled 2014-01-05: qty 2

## 2014-01-05 MED ORDER — DEXTROSE 5 % IV SOLN
1.0000 g | Freq: Once | INTRAVENOUS | Status: AC
  Administered 2014-01-05: 1 g via INTRAVENOUS

## 2014-01-05 MED ORDER — METOCLOPRAMIDE HCL 10 MG PO TABS: 10.0000 mg | ORAL_TABLET | Freq: Three times a day (TID) | ORAL | Status: DC

## 2014-01-05 MED ORDER — DOXYLAMINE-PYRIDOXINE 10-10 MG PO TBEC: 2.0000 | DELAYED_RELEASE_TABLET | ORAL | Status: DC

## 2014-01-05 MED ORDER — SODIUM CHLORIDE 0.9 % IV BOLUS (SEPSIS)
1000.0000 mL | Freq: Once | INTRAVENOUS | Status: AC
  Administered 2014-01-05: 1000 mL via INTRAVENOUS

## 2014-01-05 MED ORDER — SODIUM CHLORIDE 0.9 % IV BOLUS (SEPSIS): 500.0000 mL | Freq: Once | INTRAVENOUS | Status: DC

## 2014-01-05 MED ORDER — NITROFURANTOIN MONOHYD MACRO 100 MG PO CAPS: 100.0000 mg | ORAL_CAPSULE | Freq: Two times a day (BID) | ORAL | Status: DC

## 2014-01-05 MED ORDER — CEFTRIAXONE SODIUM 1 G IJ SOLR
INTRAMUSCULAR | Status: AC
  Filled 2014-01-05: qty 10

## 2014-01-05 NOTE — ED Notes (Signed)
Pt states 12wls pregnant with n/v x6wks, states vomiting x5 this am with clammy/dizziness

## 2014-01-05 NOTE — ED Provider Notes (Signed)
CSN: [id number]     Arrival date & time 01/05/14  3086 History   First MD Initiated Contact with Patient 01/05/14 0300     Chief Complaint  Patient presents with  . Emesis During Pregnancy     (Consider location/radiation/quality/duration/timing/severity/associated sxs/prior Treatment) Patient is a 28 y.o. female presenting with vomiting. The history is provided by the patient.  Emesis Severity:  Moderate (5 times this am) Timing:  Intermittent Quality:  Stomach contents Progression:  Unchanged Chronicity:  Recurrent Relieved by:  Nothing Worsened by:  Nothing tried Ineffective treatments:  Antiemetics (zofran and diclegis, dicelgis taken before bedtime) Associated symptoms: no abdominal pain   Risk factors: pregnant now   Kimberly Knight is a G3P1011 12 weeks by LMP, who is here tonight for vomiting that awoke her from sleep.  She has had daily vomiting with this pregnancy and previous pregnancy but to her this feels different as it wakened her from sleep and she felt clammy with same.  She believes she is dehydrated from walking around [place] last week.     History reviewed. No pertinent past medical history. Past Surgical History  Procedure Laterality Date  . Hemangioma removal 1995     Family History  Problem Relation Age of Onset  . Cancer Mother     breast, skin  . Cancer Father     Pancreatic   . Hypertension Father    History  Substance Use Topics  . Smoking status: Never Smoker   . Smokeless tobacco: Never Used  . Alcohol Use: Yes     Comment: rare   OB History    Gravida Para Term Preterm AB TAB SAB Ectopic Multiple Living   3 1 1  1  1   1      Review of Systems  Constitutional:       Felt clammy  Gastrointestinal: Positive for nausea and vomiting. Negative for abdominal pain.  All other systems reviewed and are negative.     Allergies  Review of patient's allergies indicates no known allergies.  Home Medications   Prior to Admission  medications   Medication Sig Start Date End Date Taking? Authorizing Provider  amoxicillin (AMOXIL) 500 MG capsule Take 1 capsule (500 mg total) by mouth 3 (three) times daily. 12/03/13   Guss Bunde, MD  DICLEGIS 10-10 MG TBEC Take 2 tablets by mouth as directed. 11/30/13   Manya Silvas, CNM  metoCLOPramide (REGLAN) 10 MG tablet Take 1 tablet (10 mg total) by mouth every 6 (six) hours as needed for nausea. 11/30/13   Manya Silvas, CNM  Prenatal Vit-Fe Fumarate-FA (MULTIVITAMIN-PRENATAL) 27-0.8 MG TABS tablet Take 1 tablet by mouth daily at 12 noon.    Historical Provider, MD   BP 133/65 mmHg  Pulse 93  Temp(Src) 98.8 F (37.1 C) (Oral)  Resp 20  Ht 6\' 1"  (1.854 m)  Wt 180 lb (81.647 kg)  BMI 23.75 kg/m2  SpO2 100%  LMP 10/08/2013 Physical Exam  Constitutional: She is oriented to person, place, and time. She appears well-developed and well-nourished. No distress.  HENT:  Head: Normocephalic and atraumatic.  Mouth/Throat: Oropharynx is clear and moist.  Eyes: Conjunctivae are normal. Pupils are equal, round, and reactive to light.  Neck: Normal range of motion. Neck supple.  Cardiovascular: Normal rate, regular rhythm and intact distal pulses.   Pulmonary/Chest: Effort normal and breath sounds normal. No respiratory distress. She has no wheezes. She has no rales.  Abdominal: Soft. Bowel sounds are normal. There is  no tenderness. There is no rebound and no guarding.  Musculoskeletal: Normal range of motion.  Neurological: She is alert and oriented to person, place, and time.  Skin: Skin is warm and dry.  Psychiatric: She has a normal mood and affect.    ED Course  Procedures (including critical care time) Labs Review Labs Reviewed  URINALYSIS, ROUTINE W REFLEX MICROSCOPIC  PREGNANCY, URINE    Imaging Review No results found.   EKG Interpretation None      MDM   As patient feels dehydrated 1 liter NSS bolus immediately ordered pending urinalysis, will calculate  subsequent boluses based on ketones in urine.  Have also ordered reglan 10 mg IV x 1 to help with patient's emesis.  This is pregnancy class B and is therefore safe to administer in pregnancy and it is generally well tolerated.    ED course 312 am Case d/w Heather NP in the MAU via phone.  Bolus fluids check for ketones in urine.  Begin reglan TID at home, continue Diclegis as well.  EDP expressed concern that the patient stated this felt different then her previous nauseas because it woke the her from sleep and it felt different that she felt clammy.  Nira Conn stated that they have also seen some stomach viruses recently affecting patients this way and this may be same and that is why it may be different.  Repeat ultrasound is not necessary at this time as patient has a confirmed IUP from ultrasound at [facility].    Fetal heart tones by doppler 151  Copy of urinalysis provided to the patient.  Informed patient we would be giving a dose of IV Rocephin to begin treatment of UTI.  Asked if patient had any questions at this time.  Patient stated she had no questions at that time.    Results for orders placed or performed during the hospital encounter of 01/05/14  Urinalysis, Routine w reflex microscopic  Result Value Ref Range   Color, Urine YELLOW YELLOW   APPearance CLOUDY (A) CLEAR   Specific Gravity, Urine 1.017 1.005 - 1.030   pH 6.5 5.0 - 8.0   Glucose, UA NEGATIVE NEGATIVE mg/dL   Hgb urine dipstick NEGATIVE NEGATIVE   Bilirubin Urine NEGATIVE NEGATIVE   Ketones, ur NEGATIVE NEGATIVE mg/dL   Protein, ur NEGATIVE NEGATIVE mg/dL   Urobilinogen, UA 0.2 0.0 - 1.0 mg/dL   Nitrite NEGATIVE NEGATIVE   Leukocytes, UA MODERATE (A) NEGATIVE  Pregnancy, urine  Result Value Ref Range   Preg Test, Ur POSITIVE (A) NEGATIVE  Urine microscopic-add on  Result Value Ref Range   Squamous Epithelial / LPF FEW (A) RARE   WBC, UA 3-6 <3 WBC/hpf   RBC / HPF 0-2 <3 RBC/hpf   Bacteria, UA FEW (A)  RARE   Urine-Other AMORPHOUS URATES/PHOSPHATES     Urine specific gravity is normal.  There are no ketones in the urine to suggest starvation.  Will bolus additional 500 cc of NSS following initial bolus and reassess to see if patient is feeling improved.  Given moderate leukocyte esterase and 3-6 white cells as well  Rare bacteria seen on urinalysis will treat for UTI. Asymptomatic bacteruria and UTI in pregnant women can increase emesis. Will begin therapy with first dose of IV Rocephin and will then prescribe Macrobid BID x 7 days.   Oral trial of liquids in the ED, drinking water.    At discharge states she feels much better.    Will also provide additional  RX for reglan and diclegis so patient has sufficient medication.  Follow up with your obstetrician in 7 days for recheck of urine.     Carlisle Beers, MD 01/05/14 [phone]

## 2014-01-06 LAB — URINE CULTURE: Colony Count: 90000

## 2014-01-07 ENCOUNTER — Encounter: Payer: Self-pay | Admitting: Obstetrics & Gynecology

## 2014-01-07 ENCOUNTER — Ambulatory Visit (INDEPENDENT_AMBULATORY_CARE_PROVIDER_SITE_OTHER): Payer: 59 | Admitting: Obstetrics & Gynecology

## 2014-01-07 VITALS — BP 122/77 | HR 101 | Wt 194.0 lb

## 2014-01-07 DIAGNOSIS — Z3491 Encounter for supervision of normal pregnancy, unspecified, first trimester: Secondary | ICD-10-CM

## 2014-01-07 DIAGNOSIS — Z3481 Encounter for supervision of other normal pregnancy, first trimester: Secondary | ICD-10-CM

## 2014-01-07 MED ORDER — PROMETHAZINE HCL 25 MG RE SUPP: 25.0000 mg | Freq: Four times a day (QID) | RECTAL | Status: DC | PRN

## 2014-01-07 NOTE — Progress Notes (Signed)
Routine visit. No problems now except mild nausea. She is considering a quad screen at next visit.

## 2014-01-07 NOTE — Progress Notes (Signed)
Dehydration and fluids 2 days ago.  Has another UTI and on Antibiotics

## 2014-01-15 ENCOUNTER — Other Ambulatory Visit: Payer: Self-pay | Admitting: *Deleted

## 2014-01-15 ENCOUNTER — Ambulatory Visit (INDEPENDENT_AMBULATORY_CARE_PROVIDER_SITE_OTHER): Payer: 59 | Admitting: Sports Medicine

## 2014-01-15 DIAGNOSIS — M533 Sacrococcygeal disorders, not elsewhere classified: Secondary | ICD-10-CM

## 2014-01-15 DIAGNOSIS — M51369 Other intervertebral disc degeneration, lumbar region without mention of lumbar back pain or lower extremity pain: Secondary | ICD-10-CM | POA: Insufficient documentation

## 2014-01-15 DIAGNOSIS — M5136 Other intervertebral disc degeneration, lumbar region: Secondary | ICD-10-CM | POA: Insufficient documentation

## 2014-01-15 MED ORDER — DICLOFENAC SODIUM 2 % TD SOLN: 1.0000 "application " | Freq: Two times a day (BID) | TRANSDERMAL | Status: DC

## 2014-01-15 NOTE — Assessment & Plan Note (Signed)
Continue topical Pennsaid. Tylenol prn. Rehab exercises. If no better in 2 weeks we will inject the SIJ under guidance.

## 2014-01-15 NOTE — Progress Notes (Signed)
Patient ID: Kimberly Knight, female   DOB: 17-Nov-1985, 28 y.o.   MRN: [id number]

## 2014-01-20 ENCOUNTER — Ambulatory Visit: Payer: 59

## 2014-01-20 ENCOUNTER — Other Ambulatory Visit: Payer: Self-pay | Admitting: Sports Medicine

## 2014-01-20 MED ORDER — ONDANSETRON 8 MG PO TBDP: 8.0000 mg | ORAL_TABLET | Freq: Three times a day (TID) | ORAL | Status: DC | PRN

## 2014-02-04 ENCOUNTER — Encounter: Payer: Self-pay | Admitting: *Deleted

## 2014-02-04 ENCOUNTER — Ambulatory Visit (INDEPENDENT_AMBULATORY_CARE_PROVIDER_SITE_OTHER): Payer: 59 | Admitting: Obstetrics & Gynecology

## 2014-02-04 ENCOUNTER — Encounter: Payer: Self-pay | Admitting: Obstetrics & Gynecology

## 2014-02-04 VITALS — BP 121/71 | HR 94 | Wt 199.0 lb

## 2014-02-04 DIAGNOSIS — Z3481 Encounter for supervision of other normal pregnancy, first trimester: Secondary | ICD-10-CM

## 2014-02-04 DIAGNOSIS — O219 Vomiting of pregnancy, unspecified: Secondary | ICD-10-CM

## 2014-02-04 DIAGNOSIS — Z3491 Encounter for supervision of normal pregnancy, unspecified, first trimester: Secondary | ICD-10-CM

## 2014-02-04 MED ORDER — DICLEGIS 10-10 MG PO TBEC: 2.0000 | DELAYED_RELEASE_TABLET | ORAL | Status: DC

## 2014-02-04 NOTE — Progress Notes (Signed)
Routine visit. No problems. Declines MSAFP and quad screen. Schedule anatomy scan for  19 weeks.

## 2014-02-04 NOTE — Progress Notes (Signed)
Declines Quad screen

## 2014-02-25 ENCOUNTER — Other Ambulatory Visit: Payer: Self-pay | Admitting: Obstetrics & Gynecology

## 2014-02-25 ENCOUNTER — Ambulatory Visit (HOSPITAL_COMMUNITY)
Admission: RE | Admit: 2014-02-25 | Discharge: 2014-02-25 | Disposition: A | Discharge status: Home / Self Care | Payer: 59 | Source: Ambulatory Visit | Attending: Obstetrics & Gynecology | Admitting: Obstetrics & Gynecology

## 2014-02-25 DIAGNOSIS — Z36 Encounter for antenatal screening of mother: Secondary | ICD-10-CM | POA: Insufficient documentation

## 2014-02-25 DIAGNOSIS — Z3A20 20 weeks gestation of pregnancy: Secondary | ICD-10-CM | POA: Diagnosis present

## 2014-02-25 DIAGNOSIS — Z3491 Encounter for supervision of normal pregnancy, unspecified, first trimester: Secondary | ICD-10-CM

## 2014-02-26 NOTE — L&D Delivery Note (Cosign Needed)
Delivery Note Pt pushed x 18 mins and at 4:48 AM a viable female was delivered via Vaginal, Spontaneous Delivery (Presentation: Right Occiput Anterior).  APGAR: 8, 9; weight: pending.  Nuchal cord x 1 easily reduced prior to delivery. Infant placed on pt's abd and dried. Cord clamped and cut but FOB. Hospital cord blood sample collected. Placenta status: Intact, Spontaneous.  Cord: 3 vessels with the following complications: None.    Anesthesia: Epidural  Episiotomy: None Lacerations:  2nd degree perineal Suture Repair: 3.0 vicryl Est. Blood Loss (mL):  300  Mom to postpartum.  Baby to Couplet care / Skin to Skin.  Serita Grammes CNM 07/08/2014, 5:12 AM

## 2014-02-27 DIAGNOSIS — Z3689 Encounter for other specified antenatal screening: Secondary | ICD-10-CM | POA: Insufficient documentation

## 2014-02-27 DIAGNOSIS — Z3A20 20 weeks gestation of pregnancy: Secondary | ICD-10-CM | POA: Insufficient documentation

## 2014-03-04 ENCOUNTER — Encounter: Payer: Self-pay | Admitting: Obstetrics & Gynecology

## 2014-03-04 ENCOUNTER — Ambulatory Visit (INDEPENDENT_AMBULATORY_CARE_PROVIDER_SITE_OTHER): Payer: 59 | Admitting: Obstetrics & Gynecology

## 2014-03-04 VITALS — BP 125/78 | HR 105 | Wt 209.0 lb

## 2014-03-04 DIAGNOSIS — Z3481 Encounter for supervision of other normal pregnancy, first trimester: Secondary | ICD-10-CM

## 2014-03-04 DIAGNOSIS — Z3491 Encounter for supervision of normal pregnancy, unspecified, first trimester: Secondary | ICD-10-CM

## 2014-03-04 NOTE — Progress Notes (Signed)
Nml anatomy exam.  22 pound weight gain so far.  Discussed decreasing juice and walking.  Zantac for reflux symptoms.

## 2014-03-04 NOTE — Progress Notes (Signed)
Pt c/o increased acid reflux and would like script sent to pharmacy for this. Also had cramping feeling over Christmas Holiday but better the following day

## 2014-03-30 ENCOUNTER — Encounter (HOSPITAL_COMMUNITY): Payer: Self-pay

## 2014-04-01 ENCOUNTER — Encounter: Payer: 59 | Admitting: Obstetrics & Gynecology

## 2014-04-13 ENCOUNTER — Other Ambulatory Visit: Payer: 59

## 2014-04-13 DIAGNOSIS — Z3493 Encounter for supervision of normal pregnancy, unspecified, third trimester: Secondary | ICD-10-CM

## 2014-04-14 ENCOUNTER — Telehealth: Payer: Self-pay | Admitting: *Deleted

## 2014-04-14 LAB — CBC
HCT: 34.5 % — ABNORMAL LOW (ref 36.0–46.0)
Hemoglobin: 11.5 g/dL — ABNORMAL LOW (ref 12.0–15.0)
MCH: 30.8 pg (ref 26.0–34.0)
MCHC: 33.3 g/dL (ref 30.0–36.0)
MCV: 92.5 fL (ref 78.0–100.0)
MPV: 11.5 fL (ref 8.6–12.4)
Platelets: 231 10*3/uL (ref 150–400)
RBC: 3.73 MIL/uL — AB (ref 3.87–5.11)
RDW: 13.5 % (ref 11.5–15.5)
WBC: 9.9 10*3/uL (ref 4.0–10.5)

## 2014-04-14 LAB — GLUCOSE TOLERANCE, 1 HOUR (50G) W/O FASTING: Glucose, 1 Hour GTT: 88 mg/dL (ref 70–140)

## 2014-04-14 LAB — RPR

## 2014-04-14 LAB — HIV ANTIBODY (ROUTINE TESTING W REFLEX): HIV 1&2 Ab, 4th Generation: NONREACTIVE

## 2014-04-14 NOTE — Telephone Encounter (Signed)
LM on voicemail of normal 1 hr GTT

## 2014-04-15 ENCOUNTER — Ambulatory Visit (INDEPENDENT_AMBULATORY_CARE_PROVIDER_SITE_OTHER): Payer: 59 | Admitting: Obstetrics & Gynecology

## 2014-04-15 VITALS — BP 127/75 | HR 109 | Wt 214.0 lb

## 2014-04-15 DIAGNOSIS — Z3481 Encounter for supervision of other normal pregnancy, first trimester: Secondary | ICD-10-CM

## 2014-04-15 DIAGNOSIS — Z3491 Encounter for supervision of normal pregnancy, unspecified, first trimester: Secondary | ICD-10-CM

## 2014-04-15 NOTE — Progress Notes (Signed)
Routine visit. Good FM. No problems. Order u/s for size less than dates. She had a flu vaccine at work.

## 2014-04-15 NOTE — Addendum Note (Signed)
Addended by: Gretchen Short on: 04/15/2014 03:27 PM   Modules accepted: Orders

## 2014-04-22 ENCOUNTER — Ambulatory Visit (HOSPITAL_COMMUNITY)
Admission: RE | Admit: 2014-04-22 | Discharge: 2014-04-22 | Disposition: A | Discharge status: Home / Self Care | Payer: 59 | Source: Ambulatory Visit | Attending: Obstetrics & Gynecology | Admitting: Obstetrics & Gynecology

## 2014-04-22 DIAGNOSIS — O26849 Uterine size-date discrepancy, unspecified trimester: Secondary | ICD-10-CM | POA: Insufficient documentation

## 2014-04-22 DIAGNOSIS — Z3A28 28 weeks gestation of pregnancy: Secondary | ICD-10-CM | POA: Insufficient documentation

## 2014-04-22 DIAGNOSIS — Z3481 Encounter for supervision of other normal pregnancy, first trimester: Secondary | ICD-10-CM | POA: Insufficient documentation

## 2014-04-22 DIAGNOSIS — Z3689 Encounter for other specified antenatal screening: Secondary | ICD-10-CM | POA: Insufficient documentation

## 2014-04-22 DIAGNOSIS — Z3491 Encounter for supervision of normal pregnancy, unspecified, first trimester: Secondary | ICD-10-CM

## 2014-04-29 ENCOUNTER — Ambulatory Visit (INDEPENDENT_AMBULATORY_CARE_PROVIDER_SITE_OTHER): Payer: 59 | Admitting: Obstetrics & Gynecology

## 2014-04-29 VITALS — BP 131/75 | HR 110 | Wt 214.0 lb

## 2014-04-29 DIAGNOSIS — Z3481 Encounter for supervision of other normal pregnancy, first trimester: Secondary | ICD-10-CM

## 2014-04-29 DIAGNOSIS — Z3491 Encounter for supervision of normal pregnancy, unspecified, first trimester: Secondary | ICD-10-CM

## 2014-04-29 NOTE — Progress Notes (Signed)
Routine visit. Good FM. No problems. U/s showed normal AFI and 79% growth.

## 2014-05-11 ENCOUNTER — Other Ambulatory Visit: Payer: Self-pay | Admitting: Sports Medicine

## 2014-05-11 MED ORDER — AMBULATORY NON FORMULARY MEDICATION: Status: DC

## 2014-05-13 ENCOUNTER — Telehealth: Payer: Self-pay | Admitting: Family Medicine

## 2014-05-13 MED ORDER — OSELTAMIVIR PHOSPHATE 75 MG PO CAPS: 75.0000 mg | ORAL_CAPSULE | Freq: Two times a day (BID) | ORAL | Status: DC

## 2014-05-13 NOTE — Telephone Encounter (Signed)
Husband diagnosed with flu yesterday, child with flu like symptoms thjis morning.  Mother requesting tamiflu in case she herself develops flu like symptoms, Rx provided.

## 2014-05-20 ENCOUNTER — Ambulatory Visit (INDEPENDENT_AMBULATORY_CARE_PROVIDER_SITE_OTHER): Payer: 59 | Admitting: Obstetrics & Gynecology

## 2014-05-20 VITALS — BP 132/77 | HR 108 | Wt 222.0 lb

## 2014-05-20 DIAGNOSIS — Z3481 Encounter for supervision of other normal pregnancy, first trimester: Secondary | ICD-10-CM

## 2014-05-20 DIAGNOSIS — Z23 Encounter for immunization: Secondary | ICD-10-CM | POA: Diagnosis not present

## 2014-05-20 DIAGNOSIS — Z3491 Encounter for supervision of normal pregnancy, unspecified, first trimester: Secondary | ICD-10-CM

## 2014-05-20 MED ORDER — TETANUS-DIPHTH-ACELL PERTUSSIS 5-2.5-18.5 LF-MCG/0.5 IM SUSP
0.5000 mL | Freq: Once | INTRAMUSCULAR | Status: AC
  Administered 2014-05-20: 0.5 mL via INTRAMUSCULAR

## 2014-05-20 NOTE — Progress Notes (Signed)
Routine visit. TDAP today. Good FM. We discussed rec'd weight gain.

## 2014-06-10 ENCOUNTER — Encounter: Payer: 59 | Admitting: Obstetrics & Gynecology

## 2014-06-11 ENCOUNTER — Ambulatory Visit (INDEPENDENT_AMBULATORY_CARE_PROVIDER_SITE_OTHER): Payer: 59 | Admitting: Family

## 2014-06-11 VITALS — BP 125/74 | HR 116 | Wt 221.0 lb

## 2014-06-11 DIAGNOSIS — Z3491 Encounter for supervision of normal pregnancy, unspecified, first trimester: Secondary | ICD-10-CM

## 2014-06-11 DIAGNOSIS — Z3481 Encounter for supervision of other normal pregnancy, first trimester: Secondary | ICD-10-CM

## 2014-06-11 NOTE — Progress Notes (Signed)
Doing well; rushing, her first patient scheduled at 8:15 today.  No questions or concerns.  GBS and GC/CT next visit.

## 2014-06-16 ENCOUNTER — Other Ambulatory Visit: Payer: Self-pay | Admitting: *Deleted

## 2014-06-16 ENCOUNTER — Encounter: Payer: Self-pay | Admitting: *Deleted

## 2014-06-17 ENCOUNTER — Other Ambulatory Visit: Payer: Self-pay | Admitting: Obstetrics & Gynecology

## 2014-06-17 ENCOUNTER — Ambulatory Visit (INDEPENDENT_AMBULATORY_CARE_PROVIDER_SITE_OTHER): Payer: 59 | Admitting: Obstetrics & Gynecology

## 2014-06-17 ENCOUNTER — Encounter: Payer: Self-pay | Admitting: Obstetrics & Gynecology

## 2014-06-17 VITALS — BP 111/73 | HR 99 | Wt 221.0 lb

## 2014-06-17 DIAGNOSIS — Z36 Encounter for antenatal screening of mother: Secondary | ICD-10-CM | POA: Diagnosis not present

## 2014-06-17 DIAGNOSIS — Z3491 Encounter for supervision of normal pregnancy, unspecified, first trimester: Secondary | ICD-10-CM

## 2014-06-17 DIAGNOSIS — Z3493 Encounter for supervision of normal pregnancy, unspecified, third trimester: Secondary | ICD-10-CM

## 2014-06-17 DIAGNOSIS — Z3483 Encounter for supervision of other normal pregnancy, third trimester: Secondary | ICD-10-CM

## 2014-06-17 LAB — OB RESULTS CONSOLE GC/CHLAMYDIA
Chlamydia: NEGATIVE
Gonorrhea: NEGATIVE

## 2014-06-17 LAB — OB RESULTS CONSOLE GBS: STREP GROUP B AG: NEGATIVE

## 2014-06-17 NOTE — Progress Notes (Signed)
Routine visit. Good FM. No problems. Cervical cultures obtained today. Labor precautions reviewed.

## 2014-06-18 LAB — GC/CHLAMYDIA PROBE AMP
CT Probe RNA: NEGATIVE
GC PROBE AMP APTIMA: NEGATIVE

## 2014-06-20 LAB — CULTURE, BETA STREP (GROUP B ONLY)

## 2014-06-24 ENCOUNTER — Encounter: Payer: Self-pay | Admitting: Sports Medicine

## 2014-06-24 ENCOUNTER — Ambulatory Visit (INDEPENDENT_AMBULATORY_CARE_PROVIDER_SITE_OTHER): Payer: 59 | Admitting: Obstetrics & Gynecology

## 2014-06-24 ENCOUNTER — Ambulatory Visit (INDEPENDENT_AMBULATORY_CARE_PROVIDER_SITE_OTHER): Payer: 59 | Admitting: Sports Medicine

## 2014-06-24 VITALS — BP 126/77 | HR 112 | Wt 224.0 lb

## 2014-06-24 VITALS — BP 126/77 | HR 112 | Ht 73.0 in | Wt 224.0 lb

## 2014-06-24 DIAGNOSIS — Z3491 Encounter for supervision of normal pregnancy, unspecified, first trimester: Secondary | ICD-10-CM

## 2014-06-24 DIAGNOSIS — S76112A Strain of left quadriceps muscle, fascia and tendon, initial encounter: Secondary | ICD-10-CM | POA: Diagnosis not present

## 2014-06-24 DIAGNOSIS — Z3481 Encounter for supervision of other normal pregnancy, first trimester: Secondary | ICD-10-CM

## 2014-06-24 NOTE — Assessment & Plan Note (Signed)
This will resolve quickly. Vastus medialis strain Strap with compressive dressing, reaction brace placed. Topical NSAID for pain.  Continue icing and other topical modalities.

## 2014-06-24 NOTE — Progress Notes (Signed)
Routine visit. Good FM. Denies VB, ROM, or CTXs. Labor precautions reviewed.

## 2014-06-24 NOTE — Progress Notes (Signed)
  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 Since then she's had great difficulty with extending the knee, with localized pain without radiation, there has not been much swelling. She is currently 9 months pregnant. Symptoms are moderate, persistent.  Past medical history, Surgical history, Family history not pertinant except as noted below, Social history, Allergies, and medications have been entered into the medical record, reviewed, and no changes needed.   Review of Systems: No fevers, chills, night sweats, weight loss, chest pain, or shortness of breath.   Objective:    General: Well Developed, well nourished, and in no acute distress.  Neuro: Alert and oriented x3, extra-ocular muscles intact, sensation grossly intact.  HEENT: Normocephalic, atraumatic, pupils equal round reactive to light, neck supple, no masses, no lymphadenopathy, thyroid nonpalpable.  Skin: Warm and dry, no rashes. Cardiac: Regular rate and rhythm, no murmurs rubs or gallops, no lower extremity edema.  Respiratory: Clear to auscultation bilaterally. Not using accessory muscles, speaking in full sentences. Left Knee: Normal to inspection with no erythema or effusion or obvious bony abnormalities. Tender to palpation just proximal to the medial patella over the vastus medialis tendon, there is no tenderness on the medial or lateral patellar facet, and I can reproduce pain with resisted extension of the knee, she has a negative patellar grind. There is also negative patellar compression.  ROM normal in flexion and extension and lower leg rotation. Ligaments with solid consistent endpoints including ACL, PCL, LCL, MCL. Negative Mcmurray's and provocative meniscal tests. Non painful patellar compression. Patellar and quadriceps tendons  unremarkable. Hamstring and quadriceps strength is normal.  The knee was strapped with compressive dressing, I then applied a reaction knee brace to act as an artificial quadriceps.  Impression and Recommendations:

## 2014-06-25 ENCOUNTER — Encounter: Payer: 59 | Admitting: Family

## 2014-07-01 ENCOUNTER — Ambulatory Visit (INDEPENDENT_AMBULATORY_CARE_PROVIDER_SITE_OTHER): Payer: 59 | Admitting: Obstetrics & Gynecology

## 2014-07-01 VITALS — BP 117/71 | HR 110 | Wt 225.0 lb

## 2014-07-01 DIAGNOSIS — Z3483 Encounter for supervision of other normal pregnancy, third trimester: Secondary | ICD-10-CM

## 2014-07-01 DIAGNOSIS — Z3491 Encounter for supervision of normal pregnancy, unspecified, first trimester: Secondary | ICD-10-CM

## 2014-07-01 NOTE — Progress Notes (Signed)
Routine visit. Good FM. Very tired of being pregnant. No problems except CT sxs. Labor precautions reviewed.

## 2014-07-07 ENCOUNTER — Inpatient Hospital Stay (HOSPITAL_COMMUNITY)
Admission: AD | Admit: 2014-07-07 | Discharge: 2014-07-09 | DRG: 775 | Disposition: A | Discharge status: Home / Self Care | Payer: 59 | Source: Ambulatory Visit | Attending: Obstetrics & Gynecology | Admitting: Obstetrics & Gynecology

## 2014-07-07 ENCOUNTER — Encounter (HOSPITAL_COMMUNITY): Payer: Self-pay | Admitting: *Deleted

## 2014-07-07 DIAGNOSIS — Z3491 Encounter for supervision of normal pregnancy, unspecified, first trimester: Secondary | ICD-10-CM

## 2014-07-07 DIAGNOSIS — Z3A38 38 weeks gestation of pregnancy: Secondary | ICD-10-CM | POA: Diagnosis present

## 2014-07-07 DIAGNOSIS — O4202 Full-term premature rupture of membranes, onset of labor within 24 hours of rupture: Principal | ICD-10-CM | POA: Diagnosis present

## 2014-07-07 DIAGNOSIS — O4292 Full-term premature rupture of membranes, unspecified as to length of time between rupture and onset of labor: Secondary | ICD-10-CM | POA: Diagnosis present

## 2014-07-07 DIAGNOSIS — O4290 Premature rupture of membranes, unspecified as to length of time between rupture and onset of labor, unspecified weeks of gestation: Secondary | ICD-10-CM | POA: Diagnosis present

## 2014-07-07 LAB — CBC
HCT: 29.3 % — ABNORMAL LOW (ref 36.0–46.0)
HEMOGLOBIN: 10 g/dL — AB (ref 12.0–15.0)
MCH: 29.9 pg (ref 26.0–34.0)
MCHC: 34.1 g/dL (ref 30.0–36.0)
MCV: 87.5 fL (ref 78.0–100.0)
Platelets: 181 10*3/uL (ref 150–400)
RBC: 3.35 MIL/uL — AB (ref 3.87–5.11)
RDW: 13.4 % (ref 11.5–15.5)
WBC: 8.2 10*3/uL (ref 4.0–10.5)

## 2014-07-07 LAB — ABO/RH: ABO/RH(D): A POS

## 2014-07-07 LAB — TYPE AND SCREEN
ABO/RH(D): A POS
Antibody Screen: NEGATIVE

## 2014-07-07 MED ORDER — PHENYLEPHRINE 40 MCG/ML (10ML) SYRINGE FOR IV PUSH (FOR BLOOD PRESSURE SUPPORT)
80.0000 ug | PREFILLED_SYRINGE | INTRAVENOUS | Status: DC | PRN
  Filled 2014-07-07: qty 2
  Filled 2014-07-07: qty 20

## 2014-07-07 MED ORDER — ZOLPIDEM TARTRATE 5 MG PO TABS: 5.0000 mg | ORAL_TABLET | Freq: Every evening | ORAL | Status: DC | PRN

## 2014-07-07 MED ORDER — FLEET ENEMA 7-19 GM/118ML RE ENEM: 1.0000 | ENEMA | RECTAL | Status: DC | PRN

## 2014-07-07 MED ORDER — OXYTOCIN 40 UNITS IN LACTATED RINGERS INFUSION - SIMPLE MED
62.5000 mL/h | INTRAVENOUS | Status: DC
Start: 2014-07-07 — End: 2014-07-08
  Filled 2014-07-07: qty 1000

## 2014-07-07 MED ORDER — TERBUTALINE SULFATE 1 MG/ML IJ SOLN: 0.2500 mg | Freq: Once | INTRAMUSCULAR | Status: AC | PRN

## 2014-07-07 MED ORDER — OXYCODONE-ACETAMINOPHEN 5-325 MG PO TABS: 1.0000 | ORAL_TABLET | ORAL | Status: DC | PRN

## 2014-07-07 MED ORDER — FENTANYL CITRATE (PF) 100 MCG/2ML IJ SOLN
100.0000 ug | INTRAMUSCULAR | Status: DC | PRN
Start: 2014-07-07 — End: 2014-07-08
  Administered 2014-07-08: 100 ug via INTRAVENOUS
  Filled 2014-07-07: qty 2

## 2014-07-07 MED ORDER — OXYTOCIN 40 UNITS IN LACTATED RINGERS INFUSION - SIMPLE MED
1.0000 m[IU]/min | INTRAVENOUS | Status: DC
  Administered 2014-07-07: 2 m[IU]/min via INTRAVENOUS

## 2014-07-07 MED ORDER — FENTANYL 2.5 MCG/ML BUPIVACAINE 1/10 % EPIDURAL INFUSION (WH - ANES)
14.0000 mL/h | INTRAMUSCULAR | Status: DC | PRN
  Administered 2014-07-08: 14 mL/h via EPIDURAL
  Administered 2014-07-08: 12 mL/h via EPIDURAL
  Filled 2014-07-07: qty 125

## 2014-07-07 MED ORDER — OXYTOCIN BOLUS FROM INFUSION
500.0000 mL | INTRAVENOUS | Status: DC
  Administered 2014-07-08: 500 mL via INTRAVENOUS

## 2014-07-07 MED ORDER — DIPHENHYDRAMINE HCL 50 MG/ML IJ SOLN: 12.5000 mg | INTRAMUSCULAR | Status: DC | PRN

## 2014-07-07 MED ORDER — ONDANSETRON HCL 4 MG/2ML IJ SOLN: 4.0000 mg | Freq: Four times a day (QID) | INTRAMUSCULAR | Status: DC | PRN

## 2014-07-07 MED ORDER — OXYCODONE-ACETAMINOPHEN 5-325 MG PO TABS: 2.0000 | ORAL_TABLET | ORAL | Status: DC | PRN

## 2014-07-07 MED ORDER — LIDOCAINE HCL (PF) 1 % IJ SOLN
30.0000 mL | INTRAMUSCULAR | Status: DC | PRN
  Filled 2014-07-07: qty 30

## 2014-07-07 MED ORDER — LACTATED RINGERS IV SOLN
500.0000 mL | INTRAVENOUS | Status: DC | PRN
  Administered 2014-07-08: 500 mL via INTRAVENOUS

## 2014-07-07 MED ORDER — ACETAMINOPHEN 325 MG PO TABS: 650.0000 mg | ORAL_TABLET | ORAL | Status: DC | PRN

## 2014-07-07 MED ORDER — LACTATED RINGERS IV SOLN
INTRAVENOUS | Status: DC
  Administered 2014-07-08: 01:00:00 via INTRAVENOUS

## 2014-07-07 MED ORDER — CITRIC ACID-SODIUM CITRATE 334-500 MG/5ML PO SOLN: 30.0000 mL | ORAL | Status: DC | PRN

## 2014-07-07 MED ORDER — EPHEDRINE 5 MG/ML INJ
10.0000 mg | INTRAVENOUS | Status: DC | PRN
  Filled 2014-07-07: qty 2

## 2014-07-07 NOTE — H&P (Signed)
LABOR ADMISSION HISTORY AND PHYSICAL  HPI:  Kimberly Knight is a 29 y.o. female G29P1011 with IUP at [redacted]w[redacted]d presenting for PROM. She reports +FMs.  Denies preclampsia symptoms- LOF, VB, blurry vision, headaches, peripheral edema, RUQ pain.  She plans on breastfeeding. Husband plans to get a vasectomy for birth control.  Plans on bringing child to Dr. Suzi Roots at [facility] for pediatric care after discharge.  #PROM: endorses two separate gushes of clear fluid at 0145 and 1830, each involving 2 pads, towel and pants of fluid leakage. Denies taking a bath, sexual activity, or trauma immediately before leakage of fluid.  Endorses regular contractions q27min, clear fluid, denies vaginal bleeding. Good fetal movement. Endorses pressure. Denies PROM in previous termed delivery. Denies fever, chills, or abdominal pain. Pertinent ROS: Denies Hx of STD, c-section. Denies smoking, cervical procedure, connective tissue disease.  Prenatal History/Complications:  Dr. Hulan Fray [facility]) for prenatal care. Denies abnormal findings in prenatal course.   Dating: By ultrasound --->  Estimated Date of Delivery: 07/15/14 Sono:  @[redacted]w[redacted]d , CWD, normal anatomy, cephalic presentation, 324M, 59% EFW   PObHx: 1- Miscarriage 6-8 wk spontaneous abortion.  2- termed, induced labor, vaginal delivery at 39wk.   3- Current pregnancy. PGynHx: Denies ovarian cysts, fibroids, tumors, STDs, abnormal paps PMHx: Denies asthma, HTN, DM, etc. PSug: Cosmetic superficial strawberry hemangioma removal in 1995 FHx: denies congenital anomalies, genetic problems, twins Social Hx: denies tob use, occasional drinker, works as a PA, endorses support from spouse and family in the postpartum period.    Past Medical History: History reviewed. No pertinent past medical history.  Past Surgical History: Past Surgical History  Procedure Laterality Date  . Hemangioma removal 1995      Obstetrical History: OB History    Gravida Para Term Preterm AB TAB SAB Ectopic Multiple Living   3 1 1  1  1   1       Social History: History   Social History  . Marital Status: Married    Spouse Name: N/A  . Number of Children: N/A  . Years of Education: N/A   Social History Main Topics  . Smoking status: Never Smoker   . Smokeless tobacco: Never Used  . Alcohol Use: Yes     Comment: rare  . Drug Use: No  . Sexual Activity: Yes   Other Topics Concern  . None   Social History Narrative    Family History: Family History  Problem Relation Age of Onset  . Cancer Mother     breast, skin  . Cancer Father     Pancreatic   . Hypertension Father     Allergies: No Known Allergies  Prescriptions prior to admission  Medication Sig Dispense Refill Last Dose  . Prenatal Vit-Fe Fumarate-FA (PRENATAL MULTIVITAMIN) TABS tablet Take 1 tablet by mouth daily at 12 noon.   07/07/2014 at Unknown time  . AMBULATORY NON FORMULARY MEDICATION Knee-high, medium compression, graduated compression stockings. Apply to lower extremities. S/M, calf =15inches, buy 1 get one free 1 each 0 Taking  . Diclofenac Sodium (PENNSAID) 2 % SOLN Place 1 application onto the skin 2 (two) times daily. (Patient not taking: Reported on 07/07/2014) 2 g 0 Not Taking at Unknown time  . Doxylamine-Pyridoxine (DICLEGIS) 10-10 MG TBEC Take 2 tablets by mouth as directed. (Patient not taking: Reported on 07/07/2014) 30 tablet 0 Not Taking at Unknown time     Review of Systems   All systems reviewed and negative except as stated in  HPI  Blood pressure 129/76, pulse 110, temperature 98.1 F (36.7 C), temperature source Oral, resp. rate 16, height 6\' 2"  (1.88 m), weight 102.513 kg (226 lb), last menstrual period 10/08/2013, SpO2 100 %. General appearance: alert, cooperative, appears stated age and no distress Lungs: clear to auscultation bilaterally, no increased WOB Heart: regular rate and rhythm, no m/r/g Abdomen: soft, non-tender; bowel sounds  normal Extremities: WWP, Homans sign is negative, no sign of DVT, Edema near ankle on left compared to right. Neuro: patellar DTRs 2+ Presentation: cephalic Fetal monitoringBaseline: 140 bpm. Moderate variability. Acceleration present (15x15). No decel. Uterine activityFrequency: q56min, irregular. Dilation: 2 Effacement (%): 70 Station: -3 Exam by:: L.Stubbs, RN [phone])   Prenatal labs: ABO, Rh: A/POS/-- (10/05 7510) Antibody: NEG (10/05 0842) Rubella:   RPR: NON REAC (02/16 0836)  HBsAg: NEGATIVE (10/05 2585)  HIV: NONREACTIVE (02/16 0836)  GBS: Negative (04/21 0000)  1 hr Glucola wnl (88mg /dL) Genetic screening: Declines MSAFP and quad screen Anatomy US at [redacted] wk gestation wnl  Prenatal Transfer Tool  Maternal Diabetes: No Genetic Screening: Declined Maternal Ultrasounds/Referrals: Normal Fetal Ultrasounds or other Referrals:  None Maternal Substance Abuse:  No Significant Maternal Medications:  None Significant Maternal Lab Results: None  Results for orders placed or performed during the hospital encounter of 07/07/14 (from the past 24 hour(s))  CBC   Collection Time: 07/07/14  8:12 PM  Result Value Ref Range   WBC 8.2 4.0 - 10.5 K/uL   RBC 3.35 (L) 3.87 - 5.11 MIL/uL   Hemoglobin 10.0 (L) 12.0 - 15.0 g/dL   HCT 29.3 (L) 36.0 - 46.0 %   MCV 87.5 78.0 - 100.0 fL   MCH 29.9 26.0 - 34.0 pg   MCHC 34.1 30.0 - 36.0 g/dL   RDW 13.4 11.5 - 15.5 %   Platelets 181 150 - 400 K/uL    Patient Active Problem List   Diagnosis Date Noted  . PROM (premature rupture of membranes) 07/07/2014  . Strain of left quadriceps 06/24/2014  . Uterine size date discrepancy   . Sacroiliac joint dysfunction of right side 01/15/2014  . Supervision of normal pregnancy in first trimester 11/30/2013    Assessment: Kimberly Knight is a 29 y.o. G3P1011 at [redacted]w[redacted]d here for PROM. #Labor: expectant management. Currently on 62.5 ML/hr continuous IV infusion pitocin (started at 2000) #Pain: On  tylenol every 4 hr PRN #FWB: Doing well on Strip monitoring.  #ID: GBS, Chlamydia, GC NEGATIVE.  #MOF:  #MOC: #Circ: Female fetus  Dorina Hoyer 07/07/2014, 8:38 PM   CNM attestation:  I have seen and examined this patient; I agree with above documentation in the medical student's note.   Kimberly Knight is a 29 y.o. G3P1011 @ 39.0wks by LMP and confirmed w/ 20wk scan here for PROM @ 1340 on 5/11 w/ continued leaking. No reg/strong ctx; denies bldg. Her preg has been followed by the [facility] office and has been remarkable for 1) prev macrosomic infant w/ shoulder dystocia (no EFW this preg) 2) GBS neg  PE: BP 131/60 mmHg  Pulse 93  Temp(Src) 97.7 F (36.5 C) (Oral)  Resp 18  Ht 6\' 2"  (1.88 m)  Wt 102.513 kg (226 lb)  BMI 29.00 kg/m2  SpO2 100%  LMP 10/08/2013 Gen: calm comfortable, NAD Resp: normal effort, no distress Abd: gravid EFM +accels, no decels  ROS, labs, PMH reviewed  Assess/Plan: IUP@term  PROM GBS neg  Admit to [facility] Rev'd expectant management vs induction- mostly likely will want IOL  Serita Grammes CNM 07/08/2014, 12:48 AM

## 2014-07-07 NOTE — MAU Note (Signed)
Gush of clear fluid at 1340, still coming, soaking pad.  Small amt of blood tinged  Mucous.  Some mild pains.  Last wk was 3-4cm

## 2014-07-08 ENCOUNTER — Encounter: Payer: 59 | Admitting: Family Medicine

## 2014-07-08 ENCOUNTER — Inpatient Hospital Stay (HOSPITAL_COMMUNITY): Payer: 59 | Admitting: Anesthesiology

## 2014-07-08 ENCOUNTER — Encounter (HOSPITAL_COMMUNITY): Payer: Self-pay

## 2014-07-08 DIAGNOSIS — Z3A38 38 weeks gestation of pregnancy: Secondary | ICD-10-CM

## 2014-07-08 DIAGNOSIS — O4292 Full-term premature rupture of membranes, unspecified as to length of time between rupture and onset of labor: Secondary | ICD-10-CM

## 2014-07-08 LAB — RPR: RPR: NONREACTIVE

## 2014-07-08 MED ORDER — PRENATAL MULTIVITAMIN CH
1.0000 | ORAL_TABLET | Freq: Every day | ORAL | Status: DC
  Administered 2014-07-08 – 2014-07-09 (×2): 1 via ORAL
  Filled 2014-07-08: qty 1

## 2014-07-08 MED ORDER — SENNOSIDES-DOCUSATE SODIUM 8.6-50 MG PO TABS
2.0000 | ORAL_TABLET | ORAL | Status: DC
  Administered 2014-07-09: 2 via ORAL
  Filled 2014-07-08: qty 2

## 2014-07-08 MED ORDER — BENZOCAINE-MENTHOL 20-0.5 % EX AERO
1.0000 "application " | INHALATION_SPRAY | CUTANEOUS | Status: DC | PRN
  Administered 2014-07-08: 1 via TOPICAL
  Filled 2014-07-08: qty 56

## 2014-07-08 MED ORDER — OXYCODONE-ACETAMINOPHEN 5-325 MG PO TABS: 1.0000 | ORAL_TABLET | ORAL | Status: DC | PRN

## 2014-07-08 MED ORDER — ONDANSETRON HCL 4 MG/2ML IJ SOLN: 4.0000 mg | INTRAMUSCULAR | Status: DC | PRN

## 2014-07-08 MED ORDER — DIBUCAINE 1 % RE OINT
1.0000 "application " | TOPICAL_OINTMENT | RECTAL | Status: DC | PRN
  Administered 2014-07-08: 1 via RECTAL
  Filled 2014-07-08: qty 28

## 2014-07-08 MED ORDER — BUPIVACAINE HCL (PF) 0.25 % IJ SOLN
INTRAMUSCULAR | Status: DC | PRN
  Administered 2014-07-08 (×2): 4 mL

## 2014-07-08 MED ORDER — ONDANSETRON HCL 4 MG PO TABS: 4.0000 mg | ORAL_TABLET | ORAL | Status: DC | PRN

## 2014-07-08 MED ORDER — SIMETHICONE 80 MG PO CHEW: 80.0000 mg | CHEWABLE_TABLET | ORAL | Status: DC | PRN

## 2014-07-08 MED ORDER — ACETAMINOPHEN 325 MG PO TABS: 650.0000 mg | ORAL_TABLET | ORAL | Status: DC | PRN

## 2014-07-08 MED ORDER — OXYCODONE-ACETAMINOPHEN 5-325 MG PO TABS: 2.0000 | ORAL_TABLET | ORAL | Status: DC | PRN

## 2014-07-08 MED ORDER — TETANUS-DIPHTH-ACELL PERTUSSIS 5-2.5-18.5 LF-MCG/0.5 IM SUSP: 0.5000 mL | Freq: Once | INTRAMUSCULAR | Status: DC

## 2014-07-08 MED ORDER — IBUPROFEN 600 MG PO TABS
600.0000 mg | ORAL_TABLET | Freq: Four times a day (QID) | ORAL | Status: DC
  Administered 2014-07-08 – 2014-07-09 (×5): 600 mg via ORAL
  Filled 2014-07-08 (×4): qty 1

## 2014-07-08 MED ORDER — DIPHENHYDRAMINE HCL 25 MG PO CAPS: 25.0000 mg | ORAL_CAPSULE | Freq: Four times a day (QID) | ORAL | Status: DC | PRN

## 2014-07-08 MED ORDER — LIDOCAINE-EPINEPHRINE (PF) 2 %-1:200000 IJ SOLN
INTRAMUSCULAR | Status: DC | PRN
  Administered 2014-07-08: 3 mL

## 2014-07-08 MED ORDER — ZOLPIDEM TARTRATE 5 MG PO TABS: 5.0000 mg | ORAL_TABLET | Freq: Every evening | ORAL | Status: DC | PRN

## 2014-07-08 MED ORDER — WITCH HAZEL-GLYCERIN EX PADS
1.0000 "application " | MEDICATED_PAD | CUTANEOUS | Status: DC | PRN
  Administered 2014-07-08: 1 via TOPICAL

## 2014-07-08 MED ORDER — LANOLIN HYDROUS EX OINT: TOPICAL_OINTMENT | CUTANEOUS | Status: DC | PRN

## 2014-07-08 NOTE — Progress Notes (Signed)
Kimberly Knight is a 29 y.o. G3P1011 at [redacted]w[redacted]d   Subjective: Feeling comfortable  Objective: BP 131/60 mmHg  Pulse 93  Temp(Src) 97.7 F (36.5 C) (Oral)  Resp 18  Ht 6\' 2"  (1.88 m)  Wt 102.513 kg (226 lb)  BMI 29.00 kg/m2  SpO2 100%  LMP 10/08/2013      FHT:  FHR: 130s bpm, variability: moderate,  accelerations:  Present,  decelerations:  Absent UC:   irreg SVE:   Dilation: 2 Effacement (%): 70 Station: -3 Exam by:: L.Stubbs, RN- exam deferred at present  Labs: Lab Results  Component Value Date   WBC 8.2 07/07/2014   HGB 10.0* 07/07/2014   HCT 29.3* 07/07/2014   MCV 87.5 07/07/2014   PLT 181 07/07/2014    Assessment / Plan: IUP @ 38.6wks PROM GBS neg Cx favorable  Rev'd option of expectant management w/ PROM vs induction- pt elects to start IOL Will start Pitocin to achieve reg ctx Pt plans to use either IV pain meds or epidural for pain Questions answered  Serita Grammes CNM 07/08/2014, 12:00 AM

## 2014-07-08 NOTE — Lactation Note (Signed)
This note was copied from the chart of Kimberly Dalis Beers. Lactation Consultation Note  Baby is 12 hours old and mom is c/o SN.  Comfort gels initiated by the RN.  We reviewed hand expression and mom is to call for lactation assistance at the next feeding.  Patient Name: Kimberly Knight FEXMD'Y Date: 07/08/2014     Maternal Data Has patient been taught Hand Expression?: Yes Does the patient have breastfeeding experience prior to this delivery?: Yes  Feeding Feeding Type: Breast Fed  [place] Score/Interventions                      Lactation Tools Discussed/Used     Consult Status      [name] 07/08/2014, 11:04 AM

## 2014-07-08 NOTE — Anesthesia Procedure Notes (Signed)
Epidural Patient location during procedure: OB  Staffing Anesthesiologist: Deborh Pense, CHRIS Performed by: anesthesiologist   Preanesthetic Checklist Completed: patient identified, surgical consent, pre-op evaluation, timeout performed, IV checked, risks and benefits discussed and monitors and equipment checked  Epidural Patient position: sitting Prep: site prepped and draped and DuraPrep Patient monitoring: heart rate, cardiac monitor, continuous pulse ox and blood pressure Approach: midline Location: L3-L4 Injection technique: LOR saline  Needle:  Needle type: Tuohy  Needle gauge: 17 G Needle length: 9 cm Needle insertion depth: 7 cm Catheter type: closed end flexible Catheter size: 19 Gauge Catheter at skin depth: 13 cm Test dose: negative and 2% lidocaine with Epi 1:200 K  Assessment Events: blood not aspirated, injection not painful, no injection resistance, negative IV test and no paresthesia  Additional Notes H+P and labs checked, risks and benefits discussed with the patient, consent obtained, procedure tolerated well and without complications.  Reason for block:procedure for pain   

## 2014-07-08 NOTE — Progress Notes (Signed)
Kimberly Knight is a 29 y.o. G3P1011 at [redacted]w[redacted]d   Subjective: Comfortable w/ epidural but feeling a lot of rectal pressure  Objective: BP 128/72 mmHg  Pulse 92  Temp(Src) 97.7 F (36.5 C) (Oral)  Resp 18  Ht 6\' 2"  (1.88 m)  Wt 102.513 kg (226 lb)  BMI 29.00 kg/m2  SpO2 100%  LMP 10/08/2013      FHT:  FHR: 150s bpm, variability: moderate,  accelerations:  Present,  decelerations:  Present mostly early in nature, to nadir of 80s at times but usually 90-110s UC:   regular, every 2-4 minutes w/ Pitocin @ 7mu/min SVE:   Dilation: Lip/rim Effacement (%): 100 Station: +1 Exam by:: L.Stubbs, RN  Labs: Lab Results  Component Value Date   WBC 8.2 07/07/2014   HGB 10.0* 07/07/2014   HCT 29.3* 07/07/2014   MCV 87.5 07/07/2014   PLT 181 07/07/2014    Assessment / Plan: IUP@term  End 1st stage  Will begin pushing w/ ctx Anticipate SVD  Kimberly Knight CNM 07/08/2014, 4:26 AM

## 2014-07-08 NOTE — Anesthesia Postprocedure Evaluation (Signed)
Anesthesia Post Note  Patient: Kimberly Knight  Procedure(s) Performed: * No procedures listed *  Anesthesia type: Epidural  Patient location: Mother/Baby  Post pain: Pain level controlled  Post assessment: Post-op Vital signs reviewed  Last Vitals:  Filed Vitals:   07/08/14 1000  BP: 118/70  Pulse: 100  Temp: 36.7 C  Resp: 20    Post vital signs: Reviewed  Level of consciousness: awake  Complications: No apparent anesthesia complications

## 2014-07-08 NOTE — Anesthesia Preprocedure Evaluation (Signed)
Anesthesia Evaluation  Patient identified by MRN, date of birth, ID band Patient awake    Airway Mallampati: II  TM Distance: >3 FB Neck ROM: Full    Dental  (+) Teeth Intact   Pulmonary neg pulmonary ROS,  breath sounds clear to auscultation        Cardiovascular negative cardio ROS  Rhythm:Regular     Neuro/Psych negative neurological ROS  negative psych ROS   GI/Hepatic negative GI ROS, Neg liver ROS,   Endo/Other  negative endocrine ROS  Renal/GU negative Renal ROS     Musculoskeletal   Abdominal   Peds  Hematology negative hematology ROS (+)   Anesthesia Other Findings   Reproductive/Obstetrics (+) Pregnancy                             Anesthesia Physical Anesthesia Plan  ASA: II  Anesthesia Plan: Epidural   Post-op Pain Management:    Induction:   Airway Management Planned:   Additional Equipment:   Intra-op Plan:   Post-operative Plan:   Informed Consent: I have reviewed the patients History and Physical, chart, labs and discussed the procedure including the risks, benefits and alternatives for the proposed anesthesia with the patient or authorized representative who has indicated his/her understanding and acceptance.     Plan Discussed with: Anesthesiologist  Anesthesia Plan Comments:         Anesthesia Quick Evaluation

## 2014-07-08 NOTE — Lactation Note (Signed)
This note was copied from the chart of Kimberly Micaela Stith. Lactation Consultation Note Assisted mom with better positioning and a deeper latch and she reported increased comfort with breast feeding. Reviewed depth and alignment.  Understanding verbalized. Follow-up tomorrow or sooner if needed.   Patient Name: Kimberly Knight [id number] Date: 07/08/2014 Reason for consult: Follow-up assessment   Maternal Data Has patient been taught Hand Expression?: Yes Does the patient have breastfeeding experience prior to this delivery?: Yes  Feeding Feeding Type: Breast Fed Length of feed: 15 min  LATCH Score/Interventions Latch: Repeated attempts needed to sustain latch, nipple held in mouth throughout feeding, stimulation needed to elicit sucking reflex. Intervention(s): Adjust position;Assist with latch  Audible Swallowing: Spontaneous and intermittent  Type of Nipple: Everted at rest and after stimulation  Comfort (Breast/Nipple): Filling, red/small blisters or bruises, mild/mod discomfort (comfort increased with positioning)     Hold (Positioning): Assistance needed to correctly position infant at breast and maintain latch.  LATCH Score: 7  Lactation Tools Discussed/Used     Consult Status Consult Status: Follow-up Date: 07/09/14 Follow-up type: In-patient    [name] 07/08/2014, 1:57 PM

## 2014-07-09 MED ORDER — IBUPROFEN 600 MG PO TABS: 600.0000 mg | ORAL_TABLET | Freq: Four times a day (QID) | ORAL | Status: DC

## 2014-07-09 NOTE — Discharge Instructions (Signed)

## 2014-07-09 NOTE — Discharge Summary (Signed)
Obstetric Discharge Summary Reason for Admission: rupture of membranes Prenatal Procedures: ultrasound Intrapartum Procedures: spontaneous vaginal delivery Postpartum Procedures: none Complications-Operative and Postpartum: 2nd degree perineal laceration  Delivery Note Pt pushed x 18 mins and at 4:48 AM a viable female was delivered via Vaginal, Spontaneous Delivery (Presentation: Right Occiput Anterior). APGAR: 8, 9; weight: pending. Nuchal cord x 1 easily reduced prior to delivery. Infant placed on pt's abd and dried. Cord clamped and cut but FOB. Hospital cord blood sample collected.  Placenta status: Intact, Spontaneous. Cord: 3 vessels with the following complications: None.   Anesthesia: Epidural  Episiotomy: None Lacerations: 2nd degree perineal Suture Repair: 3.0 vicryl Est. Blood Loss (mL): 300  Mom to postpartum. Baby to Couplet care / Skin to Skin.  Hospital Course:  Active Problems:   PROM (premature rupture of membranes)  Kimberly Knight is a 29 y.o. [id number] s/p SVD.  Patient was admitted PROM.  She had a postpartum course that was uncomplicated including no problems with ambulating, PO intake, urination, pain, or bleeding. The patient feels ready to go home and will be discharged with outpatient follow-up.   Today: No acute events overnight.  Pt denies problems with ambulating, voiding or po intake.  She denies nausea or vomiting.  Pain is well controlled.  She has had flatus. She has not had bowel movement.  Lochia Small.  Plan for birth control is vasectomy.  Method of Feeding: Breast.  Patient does note that she struggled throughout the night to breast feed.  She reports that she thinks her milk is slow to come in but declines staying an extra day to work on breast feeding.  She voices good understanding of the breastfeeding process.  Physical Exam:  Blood pressure 118/75, pulse 82, temperature 98 F (36.7 C), temperature source Oral, resp. rate 18, height 6\' 2"   (1.88 m), weight 226 lb (102.513 kg), last menstrual period 10/08/2013, SpO2 100 %, unknown if currently breastfeeding.  General: alert, cooperative, appears stated age and no distress  Cardio: RRR, no murmurs Pulm: CTAB, no increased WOB GI: soft, +BS Lochia: appropriate Uterine Fundus: firm Ext: WWP, no edema, +2DP DVT Evaluation: No evidence of DVT seen on physical exam. Negative Homan's sign. No cords or calf tenderness.  H/H: Lab Results  Component Value Date/Time   HGB 10.0* 07/07/2014 08:12 PM   HCT 29.3* 07/07/2014 08:12 PM    Discharge Diagnoses: Term Pregnancy-delivered  Discharge Information: Date: 07/09/2014 Activity: pelvic rest Diet: routine  Medications: PNV and Ibuprofen Breast feeding:  Yes Condition: stable Instructions: refer to handout Discharge to: home      Medication List    STOP taking these medications        Doxylamine-Pyridoxine 10-10 MG Tbec  Commonly known as:  DICLEGIS      TAKE these medications        AMBULATORY NON FORMULARY MEDICATION  - Knee-high, medium compression, graduated compression stockings.  - Apply to lower extremities. S/M, calf =15inches, buy 1 get one free     Diclofenac Sodium 2 % Soln  Commonly known as:  PENNSAID  Place 1 application onto the skin 2 (two) times daily.     ibuprofen 600 MG tablet  Commonly known as:  ADVIL,MOTRIN  Take 1 tablet (600 mg total) by mouth every 6 (six) hours.     prenatal multivitamin Tabs tablet  Take 1 tablet by mouth daily at 12 noon.           Follow-up Information  Follow up with [facility]. Schedule an appointment as soon as possible for a visit in 4 weeks.   Specialty:  Obstetrics and Gynecology   Why:  postpartum follow up   Contact information:   [place], [place] [address], [place], [place], PGY-1 07/09/2014,7:46 AM   CNM attestation I  have seen and examined this patient and agree with above documentation in the resident's note.   Kimberly Knight is a 29 y.o. [id number] s/p SVD .   Pain is well controlled.  Plan for birth control is vasectomy.  Method of Feeding: breast  PE:  BP 118/75 mmHg  Pulse 82  Temp(Src) 98 F (36.7 C) (Oral)  Resp 18  Ht 6\' 2"  (1.88 m)  Wt 102.513 kg (226 lb)  BMI 29.00 kg/m2  SpO2 100%  LMP 10/08/2013  Breastfeeding? Unknown Fundus firm   Recent Labs  07/07/14 2012  HGB 10.0*  HCT 29.3*     Plan: discharge today - postpartum care discussed - f/u clinic in 6 weeks for postpartum visit   Lakendra Helling, CNM 9:41 AM

## 2014-08-02 ENCOUNTER — Ambulatory Visit (INDEPENDENT_AMBULATORY_CARE_PROVIDER_SITE_OTHER): Payer: 59 | Admitting: Family Medicine

## 2014-08-02 VITALS — BP 105/66 | HR 80 | Wt 203.0 lb

## 2014-08-02 DIAGNOSIS — M533 Sacrococcygeal disorders, not elsewhere classified: Secondary | ICD-10-CM | POA: Diagnosis not present

## 2014-08-02 MED ORDER — KETOROLAC TROMETHAMINE 60 MG/2ML IM SOLN
60.0000 mg | Freq: Once | INTRAMUSCULAR | Status: AC
Start: 2014-08-02 — End: 2014-08-02
  Administered 2014-08-02: 60 mg via INTRAMUSCULAR

## 2014-08-02 NOTE — Progress Notes (Signed)
Return of chronic low back pain after overdoing it last week with walking and climbing.

## 2014-08-02 NOTE — Progress Notes (Signed)
   Subjective:    Patient ID: Kimberly Knight, female    DOB: September 19, 1985, 29 y.o.   MRN: [id number] Pt came in today for toradol 60mg  for back pain.  Given RUOQ with no complications. Beatris Ship, CMA HPI    Review of Systems     Objective:   Physical Exam        Assessment & Plan:

## 2014-08-05 ENCOUNTER — Ambulatory Visit: Payer: 59 | Admitting: Family Medicine

## 2014-08-18 ENCOUNTER — Encounter: Payer: Self-pay | Admitting: Obstetrics & Gynecology

## 2014-08-18 ENCOUNTER — Ambulatory Visit (INDEPENDENT_AMBULATORY_CARE_PROVIDER_SITE_OTHER): Payer: 59 | Admitting: Obstetrics & Gynecology

## 2014-09-20 NOTE — Progress Notes (Signed)
  Subjective:     Kimberly Knight is a 29 y.o. female who presents for a postpartum visit. She is 6 weeks postpartum following a spontaneous vaginal delivery. I have fully reviewed the prenatal and intrapartum course. The delivery was at 12 gestational weeks. Outcome: spontaneous vaginal delivery. Anesthesia: epidural. Postpartum course has been normal. Baby's course has been normal. Baby is feeding by breast. Bleeding no bleeding. Bowel function is normal. Bladder function is normal. Patient is sexually active. Contraception method is condoms. Postpartum depression screening: negative.  The following portions of the patient's history were reviewed and updated as appropriate: allergies, current medications, past family history, past medical history, past social history, past surgical history and problem list.  Review of Systems Pertinent items are noted in HPI.   Objective:    BP 96/62 mmHg  Pulse 69  Resp 16  Ht 6' (1.829 m)  Wt 204 lb (92.534 kg)  BMI 27.66 kg/m2  Breastfeeding? Yes  General:  alert and cooperative   Breasts:  inspection negative, no nipple discharge or bleeding, no masses or nodularity palpable  Lungs: clear to auscultation bilaterally  Heart:  regular rate and rhythm, S1, S2 normal, no murmur, click, rub or gallop  Abdomen: soft, non-tender; bowel sounds normal; no masses,  no organomegaly   Vulva:  normal  Vagina: not evaluated  Cervix:  absent  Corpus: not examined  Adnexa:  not evaluated  Rectal Exam: Not performed.        Assessment:     Nomal postpartum exam. Pap smear not done at today's visit.   Plan:    1. Contraception: vasectomy RTC 1 year/prn sooner

## 2014-12-16 ENCOUNTER — Encounter: Payer: Self-pay | Admitting: Family Medicine

## 2014-12-16 ENCOUNTER — Ambulatory Visit (INDEPENDENT_AMBULATORY_CARE_PROVIDER_SITE_OTHER): Payer: 59 | Admitting: Family Medicine

## 2014-12-16 VITALS — BP 116/63 | HR 69 | Wt 209.0 lb

## 2014-12-16 DIAGNOSIS — F32A Depression, unspecified: Secondary | ICD-10-CM

## 2014-12-16 DIAGNOSIS — F329 Major depressive disorder, single episode, unspecified: Secondary | ICD-10-CM

## 2014-12-16 MED ORDER — BUPROPION HCL ER (XL) 150 MG PO TB24: 150.0000 mg | ORAL_TABLET | ORAL | Status: DC

## 2014-12-16 NOTE — Progress Notes (Addendum)
   Subjective:    Patient ID: Kimberly Knight, female    DOB: 1985-08-10, 29 y.o.   MRN: [id number]  HPI Patient is here today to discuss her mood. She gave birth to a healthy baby girl about 4-1/2 months ago. She is now back to work full-time and under a lot of stress. She just feels very emotional and down at times. She reports increased irritability.. She is very tearful at times. Finding herself angry at her husband for no real reason. Feels like she is getting very easily frustrated. She spoke with her mother who reported that in her 50s she went on Zoloft for depression and mood. She really think she's at the point that she would like to take a medication. She has never been treated for any type of mood disorder previously.  C/o little interest or pleasure in doing thing several days of he week and feeling down several days of the week.  Says feels bad about herself daily.  Having trouble concentrating.  . No thoughts of wanting to harm herself.   She denies any thoughts of wanting to harm herself.   Review of Systems     Objective:   Physical Exam  Constitutional: She is oriented to person, place, and time. She appears well-developed and well-nourished.  HENT:  Head: Normocephalic and atraumatic.  Eyes: Conjunctivae and EOM are normal.  Cardiovascular: Normal rate.   Pulmonary/Chest: Effort normal.  Neurological: She is alert and oriented to person, place, and time.  Skin: Skin is dry. No pallor.  Psychiatric: She has a normal mood and affect. Her behavior is normal.  Vitals reviewed.         Assessment & Plan:  Acute situational depression-possibly some element of postpartum depression ( depending on definition ranges from 4 weeks to 12 months post partum). Discussed options. We'll start with Wellbutrin under 50 mg daily. New perception sent to the pharmacy. I'll see her back in about 3 weeks. If it's not working well and she would like to consider going on Zoloft which  worked well for her mother. Consider therapy or counseling if she feels that would be helpful.  PHQ- 9 score of 10

## 2015-02-22 ENCOUNTER — Ambulatory Visit (INDEPENDENT_AMBULATORY_CARE_PROVIDER_SITE_OTHER): Payer: 59 | Admitting: Sports Medicine

## 2015-02-22 DIAGNOSIS — R635 Abnormal weight gain: Secondary | ICD-10-CM | POA: Insufficient documentation

## 2015-02-22 MED ORDER — PHENTERMINE HCL 37.5 MG PO TABS: ORAL_TABLET | ORAL | Status: DC

## 2015-02-22 NOTE — Assessment & Plan Note (Signed)
Starting phentermine, return monthly for weight checks in nursing visits.

## 2015-02-22 NOTE — Progress Notes (Signed)
  Subjective:    CC: weight management  HPI: This is a pleasant 28 year old female, she has been struggling with her weight over the past year, has tried dieting and exercise, and likely needs pharmacologic assistance. She is currently nursing however plans to discontinue this and proceed with formula feeding. No headaches, visual changes, chest pain, eager to start weight loss treatment.  Past medical history, Surgical history, Family history not pertinant except as noted below, Social history, Allergies, and medications have been entered into the medical record, reviewed, and no changes needed.   Review of Systems: No fevers, chills, night sweats, weight loss, chest pain, or shortness of breath.   Objective:    General: Well Developed, well nourished, and in no acute distress.  Neuro: Alert and oriented x3, extra-ocular muscles intact, sensation grossly intact.  HEENT: Normocephalic, atraumatic, pupils equal round reactive to light, neck supple, no masses, no lymphadenopathy, thyroid nonpalpable.  Skin: Warm and dry, no rashes. Cardiac: Regular rate and rhythm, no murmurs rubs or gallops, no lower extremity edema.  Respiratory: Clear to auscultation bilaterally. Not using accessory muscles, speaking in full sentences.  Impression and Recommendations:

## 2015-02-23 ENCOUNTER — Ambulatory Visit: Payer: 59 | Admitting: Sports Medicine

## 2015-03-16 ENCOUNTER — Ambulatory Visit (INDEPENDENT_AMBULATORY_CARE_PROVIDER_SITE_OTHER): Payer: 59

## 2015-03-16 ENCOUNTER — Ambulatory Visit (INDEPENDENT_AMBULATORY_CARE_PROVIDER_SITE_OTHER): Payer: 59 | Admitting: Sports Medicine

## 2015-03-16 DIAGNOSIS — M533 Sacrococcygeal disorders, not elsewhere classified: Secondary | ICD-10-CM

## 2015-03-16 DIAGNOSIS — M545 Low back pain: Secondary | ICD-10-CM | POA: Diagnosis not present

## 2015-03-16 NOTE — Progress Notes (Signed)
  Subjective:    CC: Low back pain  HPI: This is a pleasant 30 year old female, she is a PA here in our practice, she's had axial low back pain localized to the sacral iliac joints since her pregnancy, no radiation past the knee, moderate, persistent. Overall topical diclofenac has been moderately effective as well as doing her rehabilitation exercises.  Past medical history, Surgical history, Family history not pertinant except as noted below, Social history, Allergies, and medications have been entered into the medical record, reviewed, and no changes needed.   Review of Systems: No fevers, chills, night sweats, weight loss, chest pain, or shortness of breath.   Objective:    General: Well Developed, well nourished, and in no acute distress.  Neuro: Alert and oriented x3, extra-ocular muscles intact, sensation grossly intact.  HEENT: Normocephalic, atraumatic, pupils equal round reactive to light, neck supple, no masses, no lymphadenopathy, thyroid nonpalpable.  Skin: Warm and dry, no rashes. Cardiac: Regular rate and rhythm, no murmurs rubs or gallops, no lower extremity edema.  Respiratory: Clear to auscultation bilaterally. Not using accessory muscles, speaking in full sentences. Back Exam:  Inspection: Unremarkable  Motion: Flexion 45 deg, Extension 45 deg, Side Bending to 45 deg bilaterally,  Rotation to 45 deg bilaterally  SLR laying: Negative  XSLR laying: Negative  Palpable tenderness: Tender to palpation at the left and right sacroiliac joints. FABER: negative. Sensory change: Gross sensation intact to all lumbar and sacral dermatomes.  Reflexes: 2+ at both patellar tendons, 2+ at achilles tendons, Babinski's downgoing.  Strength at foot  Plantar-flexion: 5/5 Dorsi-flexion: 5/5 Eversion: 5/5 Inversion: 5/5  Leg strength  Quad: 5/5 Hamstring: 5/5 Hip flexor: 5/5 Hip abductors: 5/5  Gait unremarkable.  Impression and Recommendations:

## 2015-03-16 NOTE — Assessment & Plan Note (Signed)
Persistent pain, topical pain said seems to work very well, Tylenol as needed, and doing her rehabilitation exercises. We are going to obtain baseline imaging considering duration of symptoms. Certainly SI joint injections remain an option.

## 2015-03-17 ENCOUNTER — Other Ambulatory Visit: Payer: Self-pay | Admitting: Sports Medicine

## 2015-03-17 MED ORDER — DICLOFENAC SODIUM 2 % TD SOLN: 1.0000 "application " | Freq: Two times a day (BID) | TRANSDERMAL | Status: DC

## 2015-03-18 ENCOUNTER — Ambulatory Visit (INDEPENDENT_AMBULATORY_CARE_PROVIDER_SITE_OTHER): Payer: 59 | Admitting: Osteopathic Medicine

## 2015-03-18 DIAGNOSIS — M533 Sacrococcygeal disorders, not elsewhere classified: Secondary | ICD-10-CM | POA: Diagnosis not present

## 2015-03-18 DIAGNOSIS — M546 Pain in thoracic spine: Secondary | ICD-10-CM

## 2015-03-18 DIAGNOSIS — M9902 Segmental and somatic dysfunction of thoracic region: Secondary | ICD-10-CM | POA: Diagnosis not present

## 2015-03-18 DIAGNOSIS — M9903 Segmental and somatic dysfunction of lumbar region: Secondary | ICD-10-CM

## 2015-03-18 DIAGNOSIS — M9901 Segmental and somatic dysfunction of cervical region: Secondary | ICD-10-CM

## 2015-03-18 MED ORDER — KETOROLAC TROMETHAMINE 60 MG/2ML IM SOLN
60.0000 mg | Freq: Once | INTRAMUSCULAR | Status: AC
  Administered 2015-03-18: 60 mg via INTRAMUSCULAR

## 2015-03-18 NOTE — Progress Notes (Signed)
HPI: Kimberly Knight is a 30 y.o. female who presents to [facility] today for chief complaint of:  Chief Complaint  Patient presents with  . Back Pain    request OMT     . Location: Upper and lower back . Quality: Sore, spasm . Severity: Severe . Duration: Chronic issue have her most recent exacerbation started yesterday . Context: Carries infant most of the time, was also playing basketball the other day and thinks she hurt her back and then    Past medical, social and family history reviewed: No past medical history on file. Past Surgical History  Procedure Laterality Date  . Hemangioma removal 1995     Social History  Substance Use Topics  . Smoking status: Never Smoker   . Smokeless tobacco: Never Used  . Alcohol Use: Yes     Comment: rare   Family History  Problem Relation Age of Onset  . Cancer Mother     breast, skin  . Cancer Father     Pancreatic   . Hypertension Father     Current Outpatient Prescriptions  Medication Sig Dispense Refill  . AMBULATORY NON FORMULARY MEDICATION Knee-high, medium compression, graduated compression stockings. Apply to lower extremities. S/M, calf =15inches, buy 1 get one free 1 each 0  . buPROPion (WELLBUTRIN XL) 150 MG 24 hr tablet Take 1 tablet (150 mg total) by mouth every morning. 30 tablet 2  . Diclofenac Sodium (PENNSAID) 2 % SOLN Place 1 application onto the skin 2 (two) times daily. 112 g 11  . ibuprofen (ADVIL,MOTRIN) 600 MG tablet Take 1 tablet (600 mg total) by mouth every 6 (six) hours. 30 tablet 0  . phentermine (ADIPEX-P) 37.5 MG tablet One tab by mouth qAM 30 tablet 0  . Prenatal Vit-Fe Fumarate-FA (PRENATAL MULTIVITAMIN) TABS tablet Take 1 tablet by mouth daily at 12 noon.     No current facility-administered medications for this visit.   No Known Allergies    Review of Systems: MUSCULOSKELETAL: (+)back pain as per HPI myalgia/arthralgia   Exam:  LMP  02/28/2015 Constitutional: VS see above. General Appearance: alert, well-developed, well-nourished, NAD Musculoskeletal: Gait normal. No clubbing/cyanosis of digits. Diffuse paraspinal muscle spasm in thoracic and lumbar region, particularly significant tender point on right thoracic region    No results found for this or any previous visit (from the past 72 hour(s)).    ASSESSMENT/PLAN: OMT procedures applied to cervical, thoracic, lumbar spine. Including myofascial release, muscle energy, attempted HVLA. Patient reports mild relief of pain, will tryToradol, return as needed for repeat treatments  Midline thoracic back pain - Plan: ketorolac (TORADOL) injection 60 mg  Somatic dysfunction of lumbar region  Somatic dysfunction of thoracic region  Somatic dysfunction of cervical region  Sacroiliac joint dysfunction of right side     No Follow-up on file.

## 2015-04-22 ENCOUNTER — Ambulatory Visit: Payer: 59

## 2015-04-25 ENCOUNTER — Telehealth: Payer: Self-pay | Admitting: Sports Medicine

## 2015-04-25 ENCOUNTER — Ambulatory Visit (INDEPENDENT_AMBULATORY_CARE_PROVIDER_SITE_OTHER): Payer: 59 | Admitting: Sports Medicine

## 2015-04-25 VITALS — BP 120/80 | HR 90 | Resp 16 | Wt 184.0 lb

## 2015-04-25 DIAGNOSIS — R635 Abnormal weight gain: Secondary | ICD-10-CM

## 2015-04-25 MED ORDER — PHENTERMINE HCL 37.5 MG PO TABS: ORAL_TABLET | ORAL | Status: DC

## 2015-04-25 MED ORDER — CIPROFLOXACIN HCL 500 MG PO TABS: 500.0000 mg | ORAL_TABLET | Freq: Two times a day (BID) | ORAL | Status: DC

## 2015-04-25 MED FILL — CIPROFLOXACIN HCL 500 MG TA: 500 | 3 days supply | Qty: 6 | Fill #0

## 2015-04-25 NOTE — Assessment & Plan Note (Signed)
Fantastic weight loss, refilling phentermine as we enter the second month.

## 2015-04-25 NOTE — Telephone Encounter (Signed)
error 

## 2015-04-25 NOTE — Progress Notes (Signed)
  Subjective:    CC: Follow-up  HPI: Danalyn returns, she's lost a great deal of weight, over 20 pounds, no side effects.  Also going on a vacation and needs Cipro to use if she develops any travellers diarrhea.  Past medical history, Surgical history, Family history not pertinant except as noted below, Social history, Allergies, and medications have been entered into the medical record, reviewed, and no changes needed.   Review of Systems: No fevers, chills, night sweats, weight loss, chest pain, or shortness of breath.   Objective:    General: Well Developed, well nourished, and in no acute distress.  Neuro: Alert and oriented x3, extra-ocular muscles intact, sensation grossly intact.  HEENT: Normocephalic, atraumatic, pupils equal round reactive to light, neck supple, no masses, no lymphadenopathy, thyroid nonpalpable.  Skin: Warm and dry, no rashes. Cardiac: Regular rate and rhythm, no murmurs rubs or gallops, no lower extremity edema.  Respiratory: Clear to auscultation bilaterally. Not using accessory muscles, speaking in full sentences.  Impression and Recommendations:

## 2015-04-26 MED FILL — PHENTERMINE 37.5 MG TABLET: 37.5 | 30 days supply | Qty: 30 | Fill #0

## 2015-04-27 ENCOUNTER — Telehealth: Payer: Self-pay | Admitting: Family Medicine

## 2015-04-27 MED ORDER — AZITHROMYCIN 250 MG PO TABS: ORAL_TABLET | ORAL | Status: AC

## 2015-04-27 MED ORDER — AZITHROMYCIN 250 MG PO TABS: ORAL_TABLET | ORAL | Status: DC

## 2015-04-27 NOTE — Telephone Encounter (Signed)
Patient c/o of 3 days of nasal congestion, sinus pressure. She is taking allergy medicine.   No fever, chills or sweets. + HA.  She is leaving for a cruise tomorrow for a week.  Would like ABX to take with her if she gets worse.

## 2015-07-07 ENCOUNTER — Other Ambulatory Visit: Payer: Self-pay | Admitting: Sports Medicine

## 2015-07-07 MED ORDER — AZITHROMYCIN 250 MG PO TABS: ORAL_TABLET | ORAL | Status: DC

## 2015-09-01 DIAGNOSIS — H5213 Myopia, bilateral: Secondary | ICD-10-CM | POA: Diagnosis not present

## 2015-09-07 ENCOUNTER — Ambulatory Visit (INDEPENDENT_AMBULATORY_CARE_PROVIDER_SITE_OTHER): Payer: 59 | Admitting: Sports Medicine

## 2015-09-07 VITALS — BP 120/70 | HR 65 | Wt 183.0 lb

## 2015-09-07 DIAGNOSIS — R635 Abnormal weight gain: Secondary | ICD-10-CM | POA: Diagnosis not present

## 2015-09-07 MED ORDER — PHENTERMINE HCL 37.5 MG PO TABS: ORAL_TABLET | ORAL | Status: DC

## 2015-09-07 MED FILL — PHENTERMINE 37.5 MG TABLET: 37.5 | 90 days supply | Qty: 90 | Fill #0

## 2015-09-07 NOTE — Progress Notes (Signed)
   Subjective:    Patient ID: Kimberly Knight, female    DOB: 06-13-1985, 30 y.o.   MRN: [id number]  HPI  Pt here to restart phentermine. Worked well in the past and denies any problems or side effects  Review of Systems     Objective:   Physical Exam        Assessment & Plan:

## 2015-09-07 NOTE — Assessment & Plan Note (Signed)
Refilling phentermine, 90 day prescription given.

## 2015-09-16 ENCOUNTER — Telehealth: Payer: Self-pay | Admitting: *Deleted

## 2015-09-20 NOTE — Telephone Encounter (Signed)
closed

## 2015-10-19 ENCOUNTER — Telehealth: Payer: Self-pay | Admitting: Family Medicine

## 2015-10-19 DIAGNOSIS — B360 Pityriasis versicolor: Secondary | ICD-10-CM

## 2015-10-19 MED ORDER — KETOCONAZOLE 2 % EX CREA: 1.0000 "application " | TOPICAL_CREAM | Freq: Two times a day (BID) | CUTANEOUS | 0 refills | Status: DC

## 2015-10-19 NOTE — Telephone Encounter (Signed)
Patient has white circular "spots on her upper back and shoulders.  No itching or irritation. Lesions visually consistant with tinea versicolor. Tx with ketoconazole.   Beatrice Lecher, MD

## 2015-10-26 ENCOUNTER — Encounter: Payer: Self-pay | Admitting: Family Medicine

## 2015-10-26 ENCOUNTER — Ambulatory Visit (INDEPENDENT_AMBULATORY_CARE_PROVIDER_SITE_OTHER): Payer: 59 | Admitting: Family Medicine

## 2015-10-26 VITALS — BP 107/60 | HR 75 | Ht 73.6 in | Wt 181.0 lb

## 2015-10-26 DIAGNOSIS — N760 Acute vaginitis: Secondary | ICD-10-CM | POA: Diagnosis not present

## 2015-10-26 DIAGNOSIS — Z0189 Encounter for other specified special examinations: Secondary | ICD-10-CM | POA: Diagnosis not present

## 2015-10-26 LAB — WET PREP, GENITAL
CLUE CELLS WET PREP: NONE SEEN
Trich, Wet Prep: NONE SEEN

## 2015-10-26 MED ORDER — FLUCONAZOLE 150 MG PO TABS: 150.0000 mg | ORAL_TABLET | Freq: Once | ORAL | 1 refills | Status: DC

## 2015-10-26 MED ORDER — FLUCONAZOLE 150 MG PO TABS: 150.0000 mg | ORAL_TABLET | Freq: Once | ORAL | 1 refills | Status: AC

## 2015-10-26 NOTE — Progress Notes (Signed)
  Subjective:     Kimberly Knight is a 30 y.o. female and is here for a comprehensive physical exam. The patient reports problems - vaginal itching an irriation x 1 day. thinks has a yeast infection. has never had one before. She is applying for adoption services. She does need a form completed today.  Social History   Social History  . Marital status: Married    Spouse name: N/A  . Number of children: N/A  . Years of education: N/A   Occupational History  . Not on file.   Social History Main Topics  . Smoking status: Never Smoker  . Smokeless tobacco: Never Used  . Alcohol use Yes     Comment: rare  . Drug use: No  . Sexual activity: Yes   Other Topics Concern  . Not on file   Social History Narrative  . No narrative on file   Health Maintenance  Topic Date Due  . INFLUENZA VACCINE  02/26/2016 (Originally 09/27/2015)  . PAP SMEAR  08/06/2016  . TETANUS/TDAP  05/19/2024  . HIV Screening  Completed    The following portions of the patient's history were reviewed and updated as appropriate: allergies, current medications, past family history, past medical history, past social history, past surgical history and problem list.  Review of Systems A comprehensive review of systems was negative.   Objective:    BP 107/60 (BP Location: Left Arm, Patient Position: Sitting, Cuff Size: Normal)   Pulse 75   Ht 6' 1.6" (1.869 m)   Wt 181 lb (82.1 kg)   LMP 10/05/2015 (Approximate)   SpO2 100%   BMI 23.49 kg/m  General appearance: alert, cooperative and appears stated age Head: Normocephalic, without obvious abnormality, atraumatic Eyes: conj clear, EOMI, PEERLA Ears: normal TM's and external ear canals both ears Nose: Nares normal. Septum midline. Mucosa normal. No drainage or sinus tenderness. Throat: lips, mucosa, and tongue normal; teeth and gums normal Neck: no adenopathy, no carotid bruit, no JVD, supple, symmetrical, trachea midline and thyroid not enlarged, symmetric,  no tenderness/mass/nodules Back: symmetric, no curvature. ROM normal. No CVA tenderness. Lungs: clear to auscultation bilaterally Heart: regular rate and rhythm, S1, S2 normal, no murmur, click, rub or gallop Abdomen: soft, non-tender; bowel sounds normal; no masses,  no organomegaly Extremities: extremities normal, atraumatic, no cyanosis or edema Pulses: 2+ and symmetric Skin: Skin color, texture, turgor normal. No rashes or lesions Lymph nodes: Cervical, supraclavicular, and axillary nodes normal. Neurologic: Alert and oriented X 3, normal strength and tone. Normal symmetric reflexes. Normal coordination and gait    Assessment:    Healthy female exam.      Plan:     See After Visit Summary for Counseling Recommendations   Keep up a regular exercise program and make sure you are eating a healthy diet Try to eat 4 servings of dairy a day, or if you are lactose intolerant take a calcium with vitamin D daily.  Your vaccines are up to date.  Form completed for adoption services. Please see copy of scanned document.  Yeast vaginitis-KOH positive. Will treat with Diflucan.

## 2015-11-10 ENCOUNTER — Encounter: Payer: 59 | Admitting: Sports Medicine

## 2015-12-02 ENCOUNTER — Encounter: Payer: Self-pay | Admitting: Family Medicine

## 2015-12-02 ENCOUNTER — Ambulatory Visit (INDEPENDENT_AMBULATORY_CARE_PROVIDER_SITE_OTHER): Payer: 59 | Admitting: Family Medicine

## 2015-12-02 VITALS — BP 116/62 | HR 75 | Wt 187.0 lb

## 2015-12-02 DIAGNOSIS — B373 Candidiasis of vulva and vagina: Secondary | ICD-10-CM | POA: Diagnosis not present

## 2015-12-02 DIAGNOSIS — B3731 Acute candidiasis of vulva and vagina: Secondary | ICD-10-CM

## 2015-12-02 MED ORDER — FLUCONAZOLE 150 MG PO TABS: 150.0000 mg | ORAL_TABLET | Freq: Once | ORAL | 1 refills | Status: AC

## 2015-12-02 MED ORDER — FLUCONAZOLE 150 MG PO TABS: 150.0000 mg | ORAL_TABLET | Freq: Once | ORAL | 1 refills | Status: DC

## 2015-12-02 NOTE — Progress Notes (Signed)
   Subjective:    Patient ID: Kimberly Knight, female    DOB: September 29, 1985, 30 y.o.   MRN: [id number]  HPI 30 you female who Quit breast-feeding about a month ago, actually came in around August 30 with vaginal itching and irritation for about a day. KOH was positive so we treated with Diflucan. She took the Diflucan for 2 days and her symptoms completely resolved. They started again a few days ago. She's had a thick white discharge particularly after going to the bathroom. Is not currently on any hormone therapy such as birth control. No recent medication changes. She denies any recent changes to detergents etc. No new soaps.   Review of Systems     Objective:   Physical Exam  Constitutional: She is oriented to person, place, and time. She appears well-developed and well-nourished.  HENT:  Head: Normocephalic and atraumatic.  Eyes: Conjunctivae and EOM are normal.  Cardiovascular: Normal rate.   Pulmonary/Chest: Effort normal.  Neurological: She is alert and oriented to person, place, and time.  Skin: Skin is dry. No pallor.  Psychiatric: She has a normal mood and affect. Her behavior is normal.  Vitals reviewed.         Assessment & Plan:  Vaginal yeast infection-we'll go ahead and treat again with Diflucan. Will extend treatment to 3 days this time.

## 2016-06-04 IMAGING — CR DG SACRUM/COCCYX 2+V
3 series · 3 of 3 positions shown · non-contrast
Comparison: Lumbar radiographs from today reported separately.

CLINICAL DATA: 29-year-old female with right side lumbar back pain
radiating to the SI joints. No known injury. Initial encounter.

EXAM:
SACRUM AND COCCYX - 2+ VIEW

[coccyx ap]
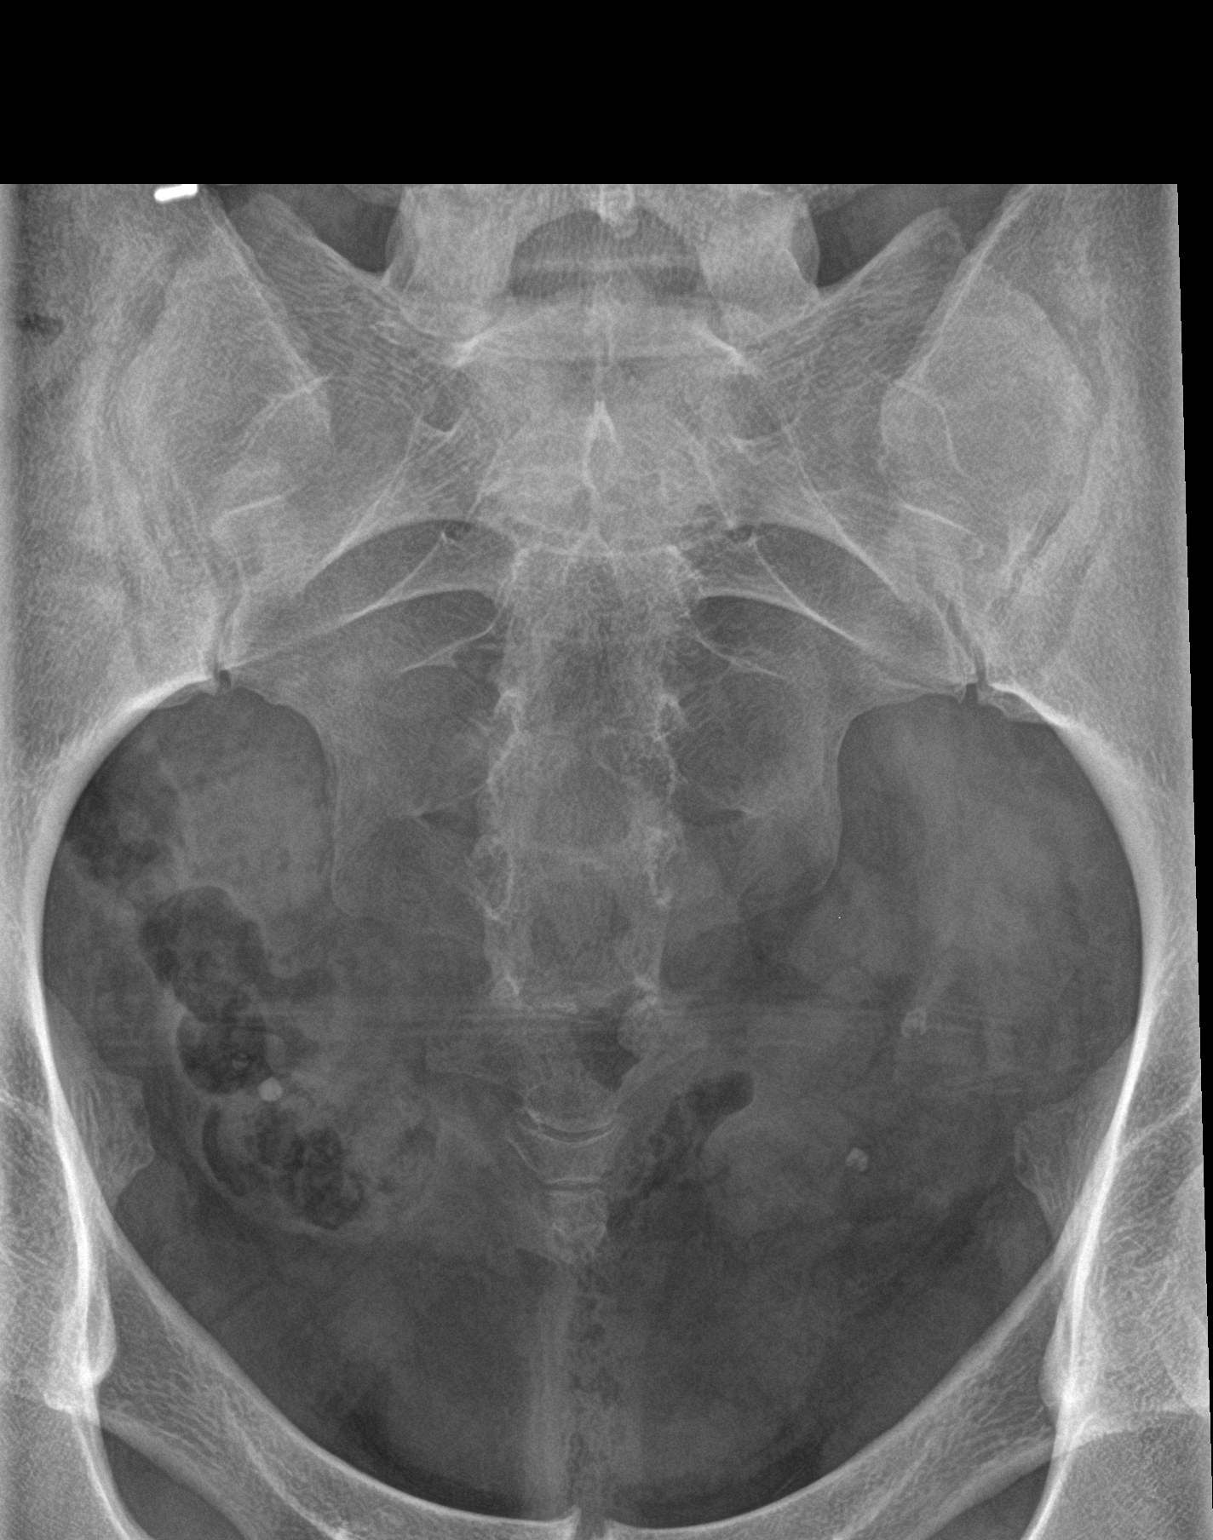

[sacrum ap]
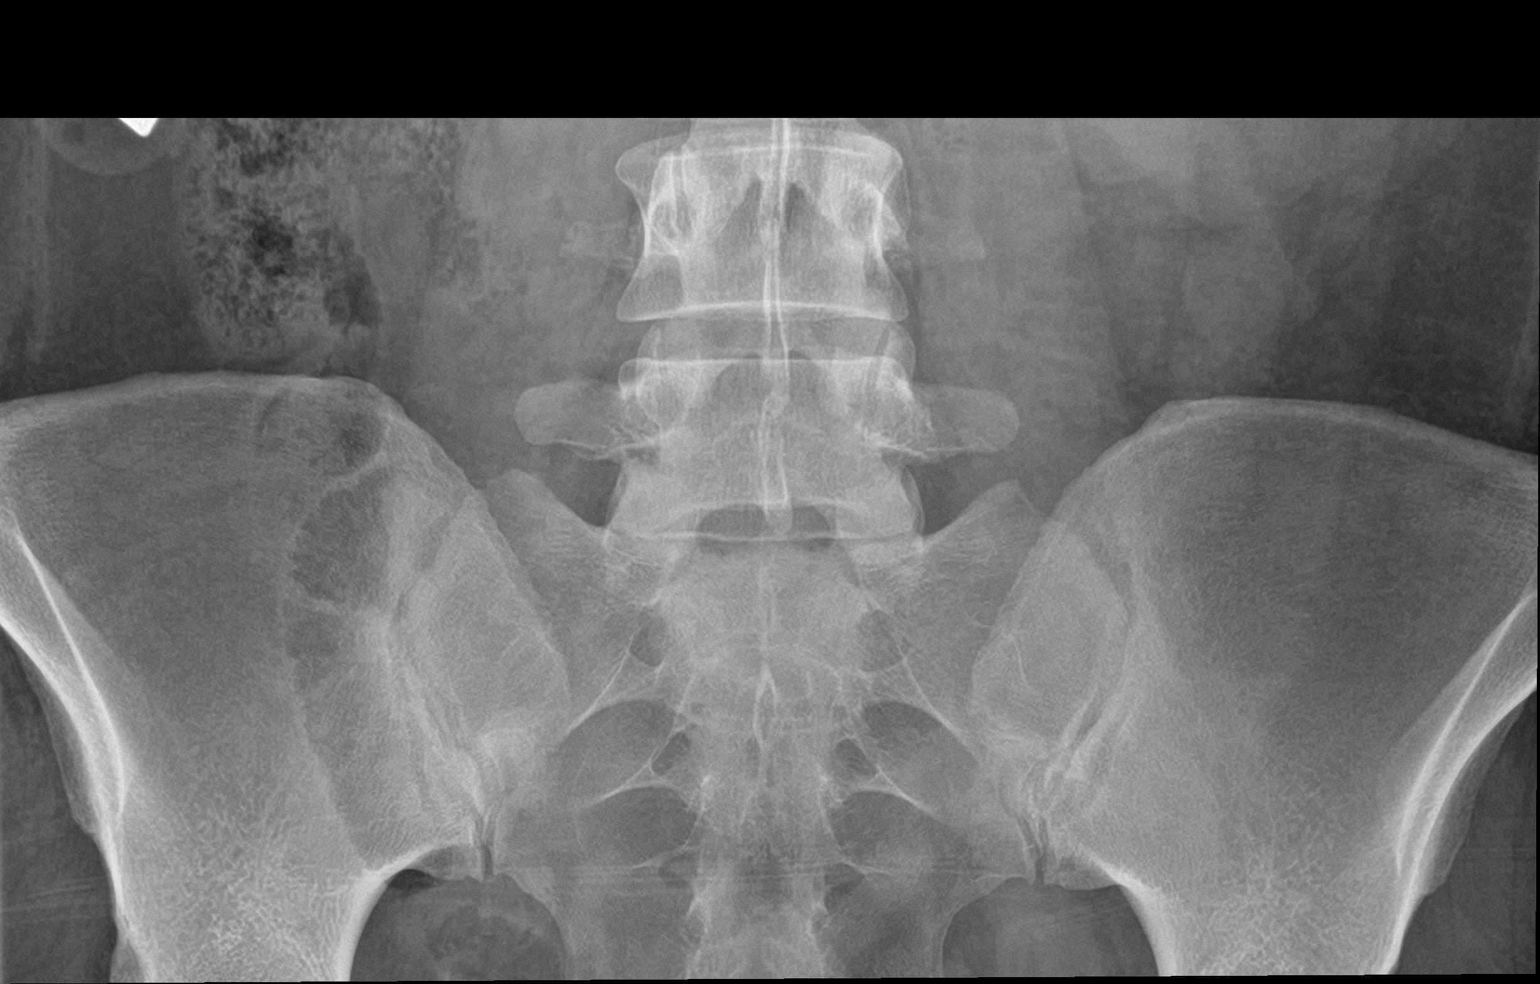

[sacrum lat]
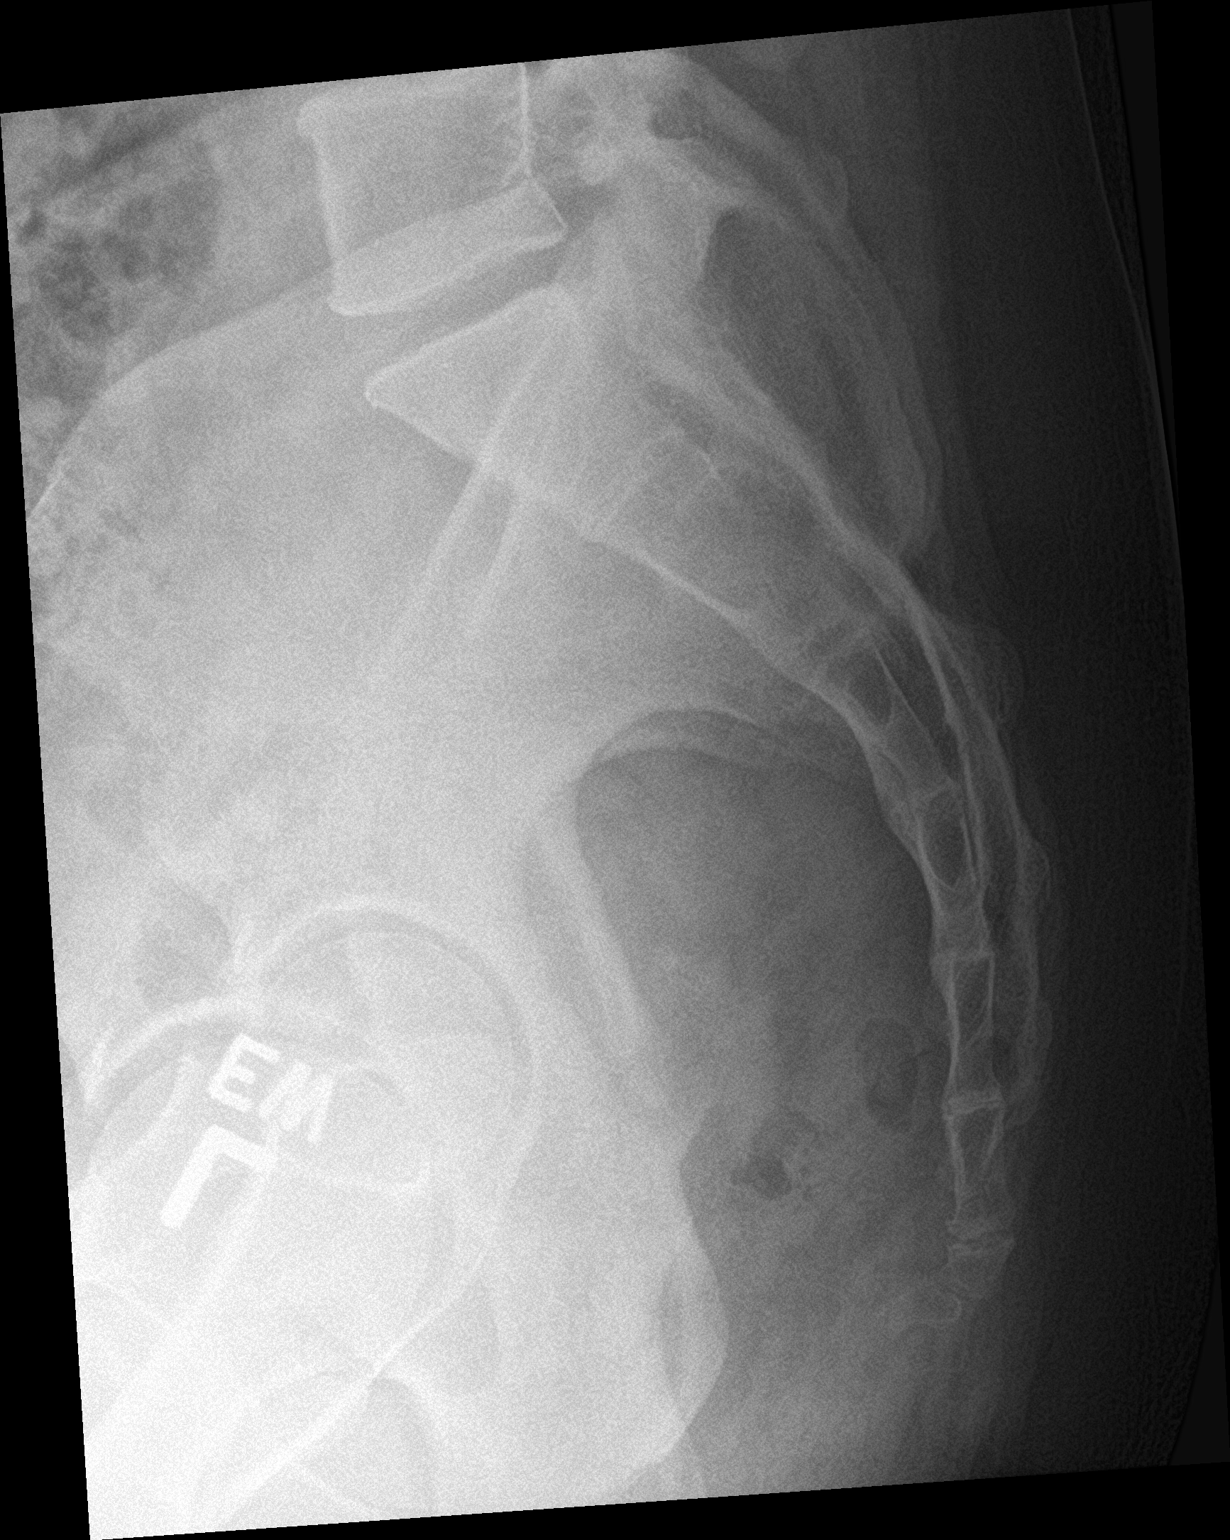

[3 of 3 positions shown; findings below may reference images not displayed]

FINDINGS: Bone mineralization is within normal limits. Sacral segments appear
intact and within normal limits. SI joints within normal limits.
Visible pelvis appears intact. Coccygeal segments within normal
limits. Small pelvic phleboliths.
IMPRESSION: Normal radiographic appearance of the sacrum and SI joints.

## 2016-06-04 IMAGING — CR DG LUMBAR SPINE COMPLETE 4+V
5 series · 5 of 5 positions shown · non-contrast
Comparison: None.

CLINICAL DATA: 29-year-old female with right side lumbar back pain
radiating to the SI joints for 1-2 years. No known injury. Initial
encounter.

EXAM:
LUMBAR SPINE - COMPLETE 4+ VIEW

[l-spine ap]
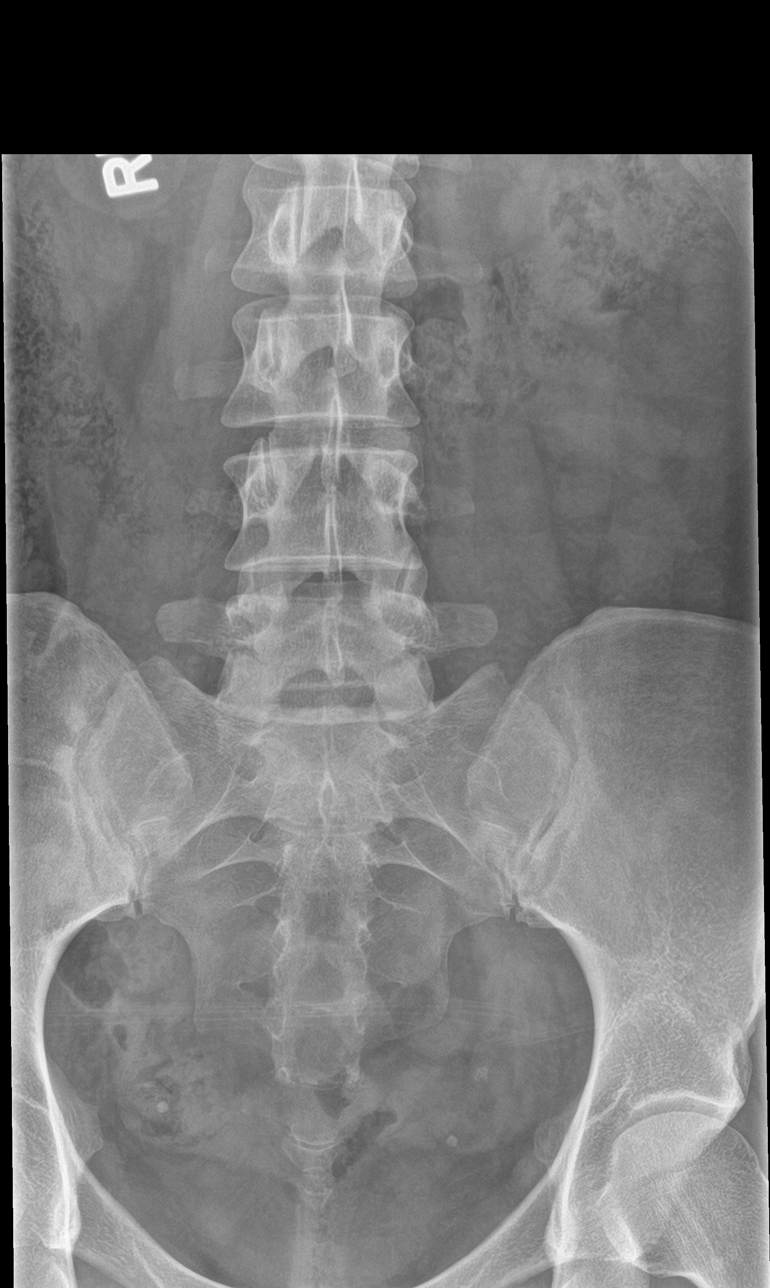

[l-spine obl (1 of 2)]
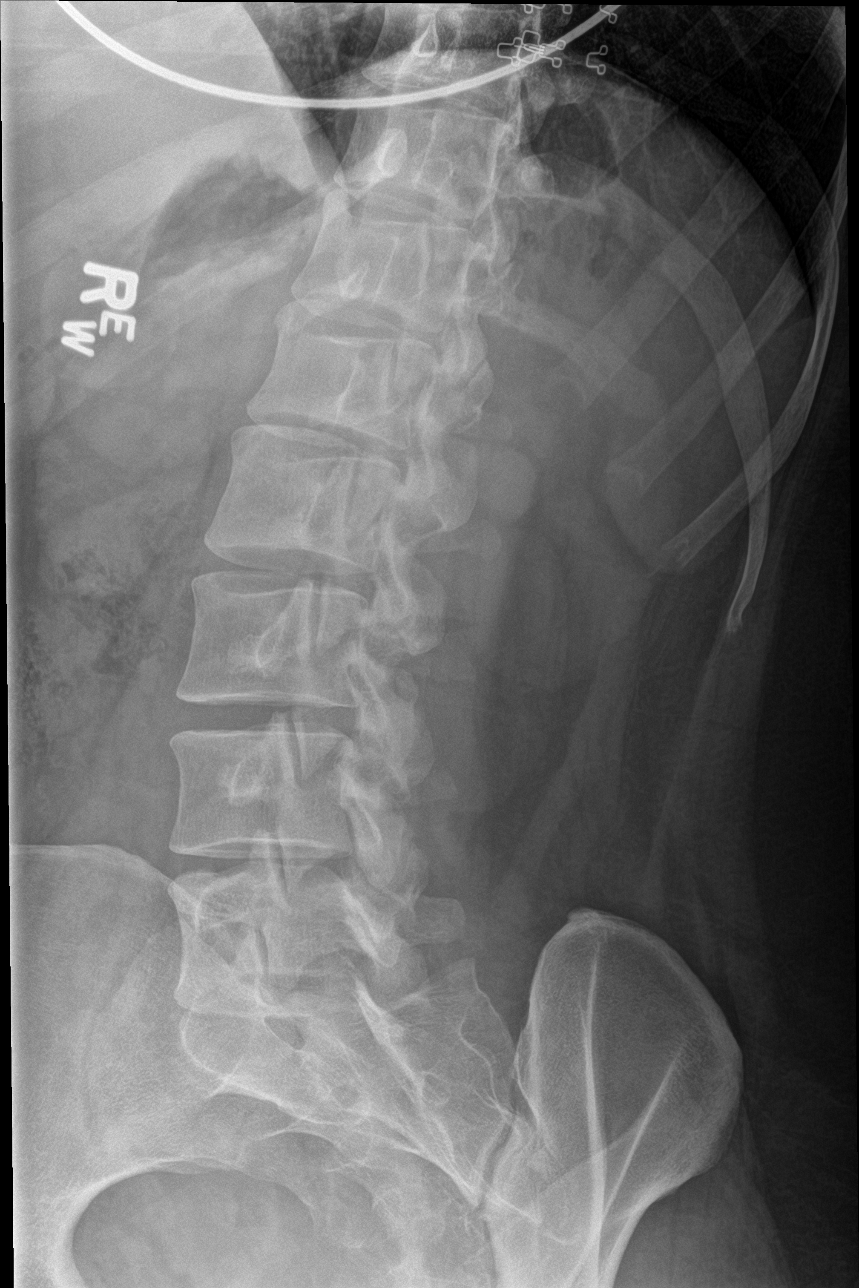

[l-spine obl (2 of 2)]
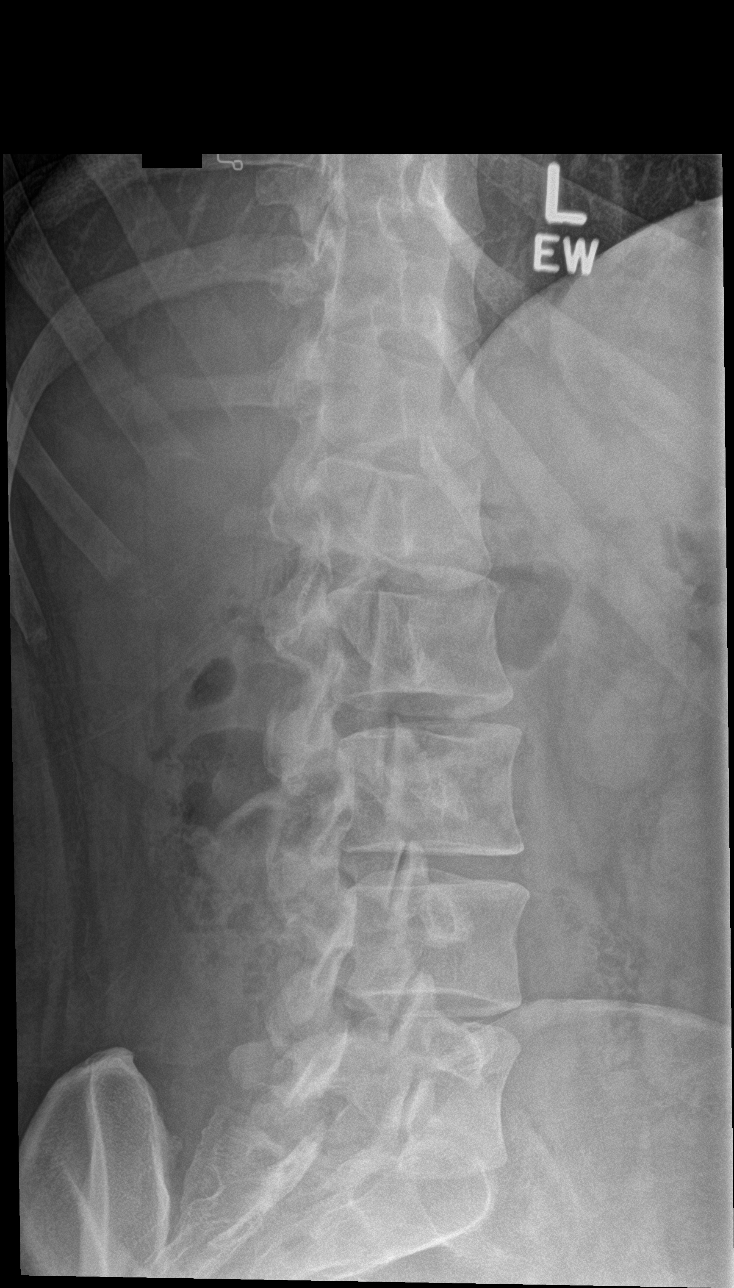

[l-spine lat]
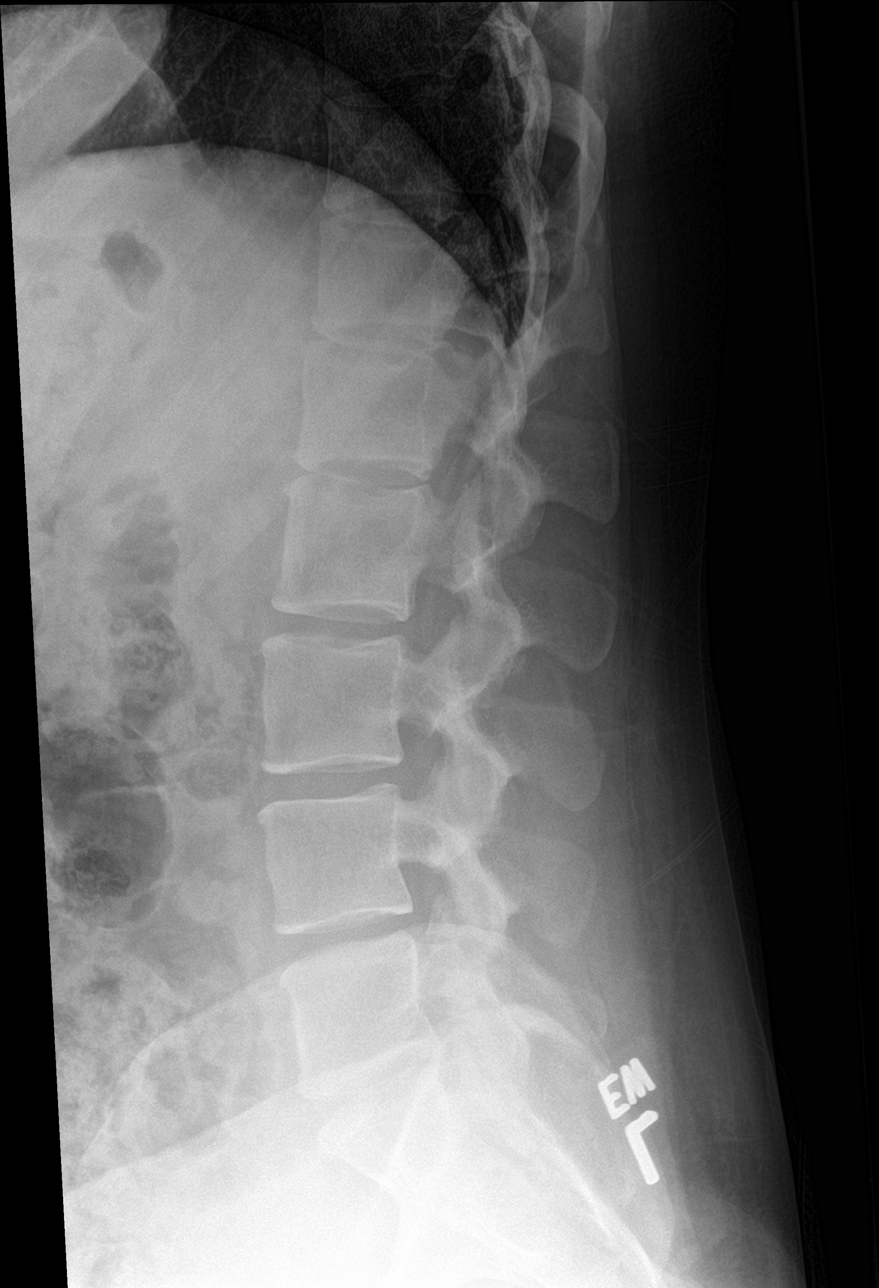

[l-spine spot]
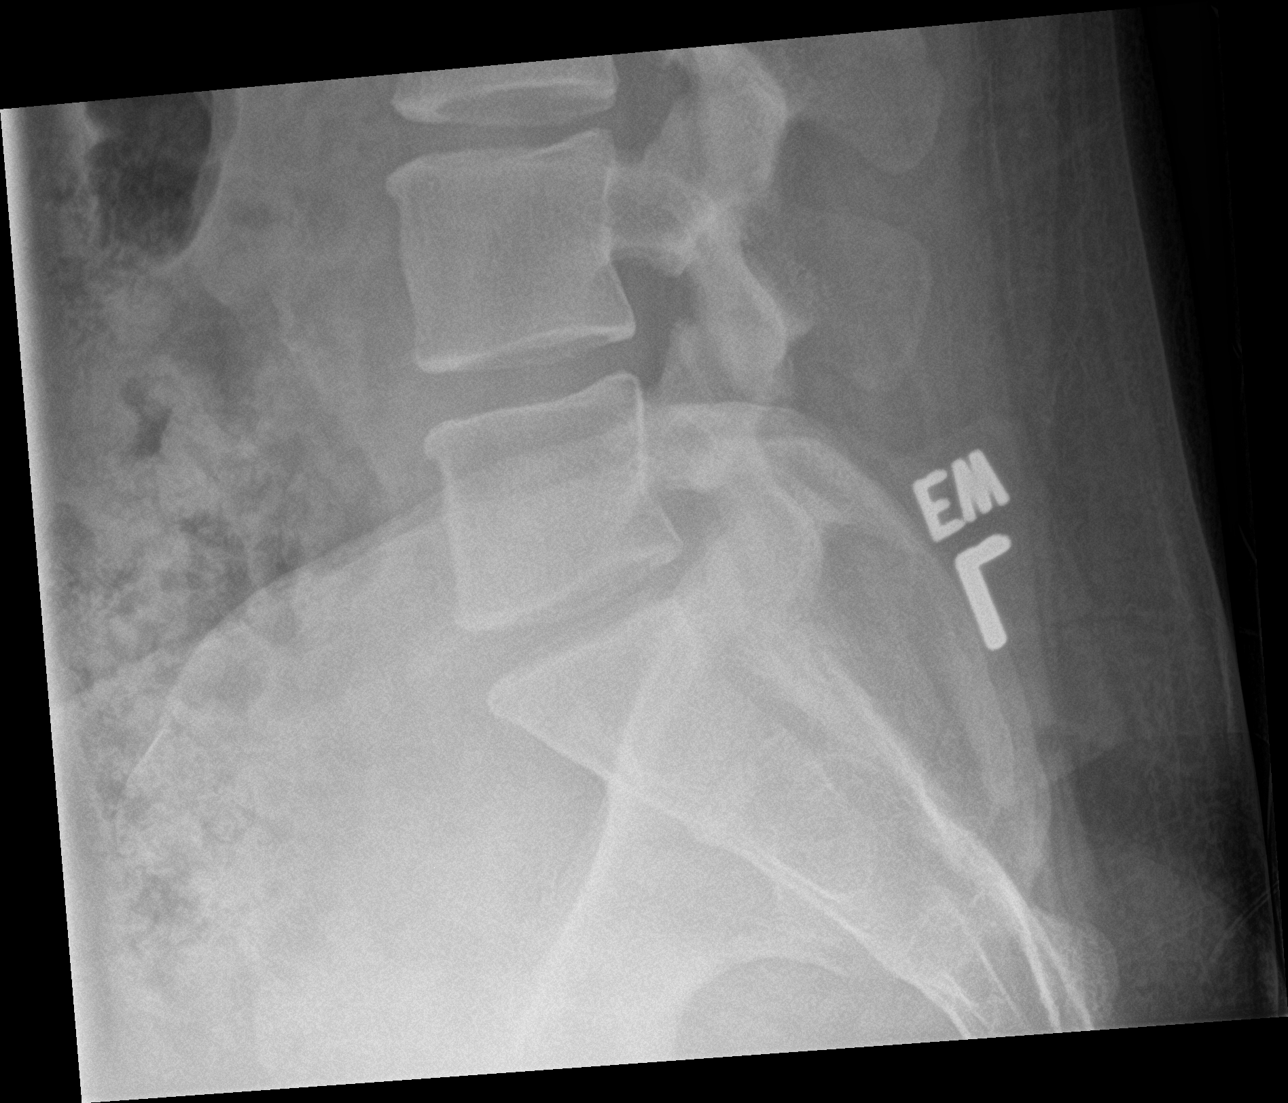

[5 of 5 positions shown; findings below may reference images not displayed]

FINDINGS: Normal lumbar segmentation. Vertebral height and alignment within
normal limits. Relatively preserved disc spaces. Mild endplate
degenerative spurring. Bone mineralization is within normal limits.
No pars fracture. SI joints within normal limits. Visible lower
thoracic levels appear intact.
IMPRESSION: Negative for age radiographic appearance of the lumbar spine.

## 2016-07-16 ENCOUNTER — Ambulatory Visit (INDEPENDENT_AMBULATORY_CARE_PROVIDER_SITE_OTHER): Payer: 59 | Admitting: Family Medicine

## 2016-07-16 ENCOUNTER — Encounter: Payer: Self-pay | Admitting: Family Medicine

## 2016-07-16 VITALS — BP 109/70 | HR 83 | Ht 74.0 in | Wt 185.0 lb

## 2016-07-16 DIAGNOSIS — G5702 Lesion of sciatic nerve, left lower limb: Secondary | ICD-10-CM

## 2016-07-16 MED ORDER — KETOROLAC TROMETHAMINE 60 MG/2ML IM SOLN
60.0000 mg | Freq: Once | INTRAMUSCULAR | Status: AC
  Administered 2016-07-16: 60 mg via INTRAMUSCULAR

## 2016-07-16 MED ORDER — CYCLOBENZAPRINE HCL 10 MG PO TABS: 5.0000 mg | ORAL_TABLET | Freq: Every evening | ORAL | 1 refills | Status: DC | PRN

## 2016-07-16 MED FILL — CYCLOBENZAPRINE 10 MG TAB: 10 | 30 days supply | Qty: 30 | Fill #0

## 2016-07-16 NOTE — Progress Notes (Signed)
   Subjective:    Patient ID: Kimberly Knight, female    DOB: Mar 23, 1985, 31 y.o.   MRN: [id number]  HPI 81 Female comes in today with left low back pain. She says it started this morning while she was working out at [employer]. She felt a sudden discomfort. She has had problems with her low back before. Now it's starting to feel like it's tightening up and is hard to stand up straight. He feels like a muscle spasm.He denies any radicular symptoms.   Review of Systems     Objective:   Physical Exam  Constitutional: She is oriented to person, place, and time. She appears well-developed and well-nourished.  HENT:  Head: Normocephalic and atraumatic.  Eyes: Conjunctivae and EOM are normal.  Cardiovascular: Normal rate.   Pulmonary/Chest: Effort normal.  Musculoskeletal:  Nontender over the lumbar spine but she is tender over the left piriform this. Nontender over the SI joints. She's having difficulty with forward flexion and with even extending fully.  Neurological: She is alert and oriented to person, place, and time.  Skin: Skin is dry. No pallor.  Psychiatric: She has a normal mood and affect. Her behavior is normal.  Vitals reviewed.         Assessment & Plan:  Left low back pain-consistent with piriformis syndrome. She's not getting any radicular symptoms. Toradol 60 mg injection given today.Recommend apply heat.  REfer for PT.  Send her a prescription for Flexeril.

## 2016-07-20 ENCOUNTER — Telehealth: Payer: Self-pay | Admitting: Sports Medicine

## 2016-07-20 MED ORDER — PREDNISONE 50 MG PO TABS: 50.0000 mg | ORAL_TABLET | Freq: Every day | ORAL | 0 refills | Status: DC

## 2016-07-20 NOTE — Telephone Encounter (Signed)
Persistent axial low back pain with radiation to the buttock, worse with sitting, flexion, Valsalva, nothing radicular, no bowel or bladder dysfunction. Straight leg raise did reproduce some of the back pain and there was a bilateral positive Patrick's test but no tenderness over the SI joints themselves. X-rays from the past did show endplate reactive changes, as well as mild grade 1 retrolisthesis of L5 on S1. Prednisone, rehabilitation exercises, we will revisit this in a couple of weeks.

## 2016-07-30 ENCOUNTER — Encounter: Payer: Self-pay | Admitting: Rehabilitative and Restorative Service Providers"

## 2016-07-30 ENCOUNTER — Ambulatory Visit (INDEPENDENT_AMBULATORY_CARE_PROVIDER_SITE_OTHER): Payer: 59 | Admitting: Rehabilitative and Restorative Service Providers"

## 2016-07-30 DIAGNOSIS — M545 Low back pain: Secondary | ICD-10-CM

## 2016-07-30 DIAGNOSIS — R29898 Other symptoms and signs involving the musculoskeletal system: Secondary | ICD-10-CM | POA: Diagnosis not present

## 2016-07-30 NOTE — Therapy (Signed)
[facility] [place], [place], [address] Phone: [phone]   Fax:  [phone]  Physical Therapy Evaluation  Patient Details  Name: Kimberly Knight MRN: [id number] Date of Birth: December 29, 1985 Referring Provider: Dr Madilyn Fireman   Encounter Date: 07/30/2016      PT End of Session - 07/30/16 1147    Visit Number 1   Number of Visits 12   Date for PT Re-Evaluation 09/10/16   PT Start Time 1147   PT Stop Time 9983   PT Time Calculation (min) 71 min   Activity Tolerance Patient tolerated treatment well      History reviewed. No pertinent past medical history.  Past Surgical History:  Procedure Laterality Date  . hemangioma removal 1995      There were no vitals filed for this visit.       Subjective Assessment - 07/30/16 1151    Subjective Patient reports that she had onset of LBP and pain into the Lt hip/LE after she started an exercise program ~4 weeks. She played basketball 5/20 and was very physical with playing. She felt pop and pain the following am on the treadmill. Pain persisted - some improvement with manual work and stretching. She received massage which was helpful. she started predisone 07/20/16 which has continued to improve symptoms.   Pertinent History history of LP for the past 2 years   How long can you sit comfortably? 20-30 min    How long can you stand comfortably? 30-60 min    How long can you walk comfortably? no limit - best with walking    Diagnostic tests xrays   Patient Stated Goals get rid of pain; strengthening core    Currently in Pain? No/denies   Pain Location Back   Pain Orientation Left   Pain Radiating Towards Lt hip    Pain Onset 1 to 4 weeks ago   Pain Frequency Intermittent   Aggravating Factors  lifting legs - feels pain in the LB and Lt hip; lifting; bending   Pain Relieving Factors pool exercise; heat; meds             [facility] PT Assessment - 07/30/16 0001       Assessment   Medical Diagnosis Lumbar radiculopathy; piriformis syndrome    Referring Provider Dr Madilyn Fireman    Onset Date/Surgical Date 07/15/16   Hand Dominance Right   Next MD Visit as needed    Prior Therapy none      Precautions   Precautions None     Balance Screen   Has the patient fallen in the past 6 months No   Has the patient had a decrease in activity level because of a fear of falling?  No   Is the patient reluctant to leave their home because of a fear of falling?  No     Home Environment   Additional Comments multilevel home no trouble with steps      Prior Function   Level of Independence Independent   Vocation Full time employment  32 hr/wk   Vocation Requirements PA    Leisure childcare; household chores; exercise; sports; ultimate frisbee      Observation/Other Assessments   Focus on Therapeutic Outcomes (FOTO)  49% limitation      Sensation   Additional Comments one episode of numbness into the Lt foot lasting for ~ 3 hours resolved on its own      Posture/Postural Control   Posture Comments head  forward; decreased lumbar lordosis; LE's in ER      AROM   Lumbar Flexion 75%  pain Lt hip    Lumbar Extension 20%   pain Lt hip    Lumbar - Right Side Bend 75%   Lumbar - Left Side Bend 80%   Lumbar - Right Rotation 45%   Lumbar - Left Rotation 45%     Strength   Overall Strength Comments 5/5 bilat LE's      Flexibility   Hamstrings tight Rt 65 deg; Lt 60 deg    Quadriceps tight bilat 115 deg   ITB tight    Piriformis tight Lt > Rt      Palpation   Spinal mobility hypomobile lumbar spine pain with CPA mobs L4/5 area    Palpation comment tight Lt piriformis/glut med      Special Tests    Special Tests --  (+)slump test Lt > Rt             Objective measurements completed on examination: See above findings.          [facility] Adult PT Treatment/Exercise - 07/30/16 0001      Lumbar Exercises: Stretches   Passive Hamstring Stretch 2  reps;30 seconds  supine with strap    Press Ups 5 reps  1-2 sec   Quad Stretch 2 reps;30 seconds  prone with strap    Piriformis Stretch 5 reps;30 seconds  supine travell - varying angles      Lumbar Exercises: Supine   Ab Set --  3 part core 10 sec x 10    Bridge 10 reps  5 sec hold with core engaged      Moist Heat Therapy   Number Minutes Moist Heat 15 Minutes   Moist Heat Location Lumbar Spine;Hip  Lt      Acupuncturist Location Lt lumbar to Lt piriformis    Electrical Stimulation Action IFC   Electrical Stimulation Parameters to tolerance   Electrical Stimulation Goals Pain;Tone     Manual Therapy   Manual therapy comments patient prone    Joint Mobilization COA Grade II lumbar spine    Soft tissue mobilization Lt posterior hip through the piriformis and glut medius    Myofascial Release Lt posterior hip           Trigger Point Dry Needling - 07/30/16 1313    Consent Given? Yes   Education Handout Provided Yes   Muscles Treated Lower Body --  Lt only x 2 needles    Gluteus Maximus Response Palpable increased muscle length   Piriformis Response Palpable increased muscle length              PT Education - 07/30/16 1248    Education provided Yes   Education Details HEP DN   Person(s) Educated Patient   Methods Explanation;Demonstration;Tactile cues;Verbal cues;Handout   Comprehension Verbalized understanding;Returned demonstration;Verbal cues required;Tactile cues required             PT Long Term Goals - 07/30/16 1319      PT LONG TERM GOAL #1   Title Improve tissue extensibility in trunk and LE with patient to demonstrate HS flexibilty to ~80 deg bilat 09/10/16   Time 6   Period Weeks   Status New     PT LONG TERM GOAL #2   Title Improve core stability with patient to demonstrate core strengthening program with good contraction of core 09/10/16   Time 6  Period Weeks   Status New     PT LONG TERM GOAL  #3   Title Improve tolerance for functional activities allowing patient to return to normal childcare and household activities including lifting 31 year old without pain 09/10/16   Time 6   Period Weeks   Status New     PT LONG TERM GOAL #4   Title Independent in HEP 09/10/16   Time 6   Period Weeks   Status New     PT LONG TERM GOAL #5   Title Improve FOTO to </= 23% limtiation 09/10/16   Time 6   Period Weeks   Status New                Plan - 07/30/16 1314    Clinical Impression Statement Kimberly Knight presents with resloving LBP and Lt hip/LE pain. She has limited trunk and LE mobilty and tissue extensibility without flexion or extension bias; decreased core strength/stability; muscular tightness to palpation in Lt piriformis/hip abductors; limited LE mobilty/ROM with muscular limitations Lt > Rt HS/quads/hip flexors/hip rotators; radicular pain into Lt LE; pain limiting functional activity level. Patient will benefit from PT to address problems identified.    Clinical Presentation Evolving   Clinical Decision Making Moderate   Rehab Potential Good   PT Frequency 2x / week   PT Duration 6 weeks   PT Treatment/Interventions Patient/family education;ADLs/Self Care Home Management;Cryotherapy;Electrical Stimulation;Iontophoresis 4mg /ml Dexamethasone;Moist Heat;Traction;Ultrasound;Dry needling;Manual techniques;Therapeutic activities;Therapeutic exercise;Neuromuscular re-education   PT Next Visit Plan assess response to DN; manual wpork Lt piriformis/hip abductors; sitting hip flexor stretch; core stabilization; modalities as indicated    Consulted and Agree with Plan of Care Patient      Patient will benefit from skilled therapeutic intervention in order to improve the following deficits and impairments:  Postural dysfunction, Improper body mechanics, Pain, Decreased range of motion, Decreased mobility, Decreased strength, Increased fascial restricitons, Increased muscle spasms,  Decreased activity tolerance  Visit Diagnosis: Other symptoms and signs involving the musculoskeletal system - Plan: PT plan of care cert/re-cert  Acute left-sided low back pain, with sciatica presence unspecified - Plan: PT plan of care cert/re-cert     Problem List Patient Active Problem List   Diagnosis Date Noted  . Strain of left quadriceps 06/24/2014  . Sacroiliac joint dysfunction of right side 01/15/2014    Jamarco Zaldivar Nilda Simmer PT, MPH  07/30/2016, 1:26 PM  [facility] [place], [place], [address] Phone: [phone]   Fax:  [phone]  Name: Kimberly Knight MRN: [id number] Date of Birth: [birth year]

## 2016-07-30 NOTE — Patient Instructions (Signed)
Abdominal Bracing With Pelvic Floor (Hook-Lying)    With neutral spine, tighten pelvic floor and abdominals sucking belly button to back bone; tighten muscles in back at waist. Hold 10-15 sec  Repeat _10__ times. Do _several__ times a day. Progress to sitting; standing; and with functional activities    HIP: Hamstrings - Supine   Place strap around foot. Raise leg up, keeping knee straight.  Bend opposite knee to protect back if indicated. Hold 30 seconds. 3 reps per set, 2-3 sets per day     Outer Hip Stretch: Reclined IT Band Stretch (Strap)   Strap around one foot, pull leg across body until you feel a pull or stretch, with shoulders on mat. Hold for 30 seconds. Repeat 3 times each leg. 2-3 times/day.  Piriformis Stretch   Lying on back, pull right knee toward opposite shoulder. Hold 30 seconds. Repeat 3 times. Do 2-3 sessions per day.    Quads / HF, Prone   Lie face down. Grasp one ankle with same-side hand. Use towel if needed to reach. Gently pull foot toward buttock.  Hold 30 seconds. Repeat 3 times per session. Do 2-3 sessions per day.   Bridging    Slowly raise buttocks from floor, keeping core tight. Hold 5 sec Repeat ___10_ times per set. Do _1-2___ sets per session. Do _1-2___ sessions per day.   Trigger Point Dry Needling  . What is Trigger Point Dry Needling (DN)? o DN is a physical therapy technique used to treat muscle pain and dysfunction. Specifically, DN helps deactivate muscle trigger points (muscle knots).  o A thin filiform needle is used to penetrate the skin and stimulate the underlying trigger point. The goal is for a local twitch response (LTR) to occur and for the trigger point to relax. No medication of any kind is injected during the procedure.   . What Does Trigger Point Dry Needling Feel Like?  o The procedure feels different for each individual patient. Some patients report that they do not actually feel the needle enter the skin  and overall the process is not painful. Very mild bleeding may occur. However, many patients feel a deep cramping in the muscle in which the needle was inserted. This is the local twitch response.   Marland Kitchen How Will I feel after the treatment? o Soreness is normal, and the onset of soreness may not occur for a few hours. Typically this soreness does not last longer than two days.  o Bruising is uncommon, however; ice can be used to decrease any possible bruising.  o In rare cases feeling tired or nauseous after the treatment is normal. In addition, your symptoms may get worse before they get better, this period will typically not last longer than 24 hours.   . What Can I do After My Treatment? o Increase your hydration by drinking more water for the next 24 hours. o You may place ice or heat on the areas treated that have become sore, however, do not use heat on inflamed or bruised areas. Heat often brings more relief post needling. o You can continue your regular activities, but vigorous activity is not recommended initially after the treatment for 24 hours. o DN is best combined with other physical therapy such as strengthening, stretching, and other therapies.

## 2016-09-05 ENCOUNTER — Ambulatory Visit (INDEPENDENT_AMBULATORY_CARE_PROVIDER_SITE_OTHER): Payer: 59 | Admitting: Family Medicine

## 2016-09-05 ENCOUNTER — Ambulatory Visit (INDEPENDENT_AMBULATORY_CARE_PROVIDER_SITE_OTHER): Payer: 59 | Admitting: Rehabilitative and Restorative Service Providers"

## 2016-09-05 ENCOUNTER — Encounter: Payer: Self-pay | Admitting: Rehabilitative and Restorative Service Providers"

## 2016-09-05 VITALS — BP 127/85 | HR 112 | Temp 98.0°F | Ht 74.0 in

## 2016-09-05 DIAGNOSIS — M545 Low back pain: Secondary | ICD-10-CM | POA: Diagnosis not present

## 2016-09-05 DIAGNOSIS — R29898 Other symptoms and signs involving the musculoskeletal system: Secondary | ICD-10-CM

## 2016-09-05 DIAGNOSIS — J0100 Acute maxillary sinusitis, unspecified: Secondary | ICD-10-CM | POA: Diagnosis not present

## 2016-09-05 MED ORDER — AMOXICILLIN-POT CLAVULANATE 875-125 MG PO TABS: 1.0000 | ORAL_TABLET | Freq: Two times a day (BID) | ORAL | 0 refills | Status: DC

## 2016-09-05 NOTE — Patient Instructions (Addendum)
  Place hands on furniture at about chest height, one on top of the other. Tighten core Exhale as you press down with hands Hold 5-10 sec  Repeat 5-10 times   Low Row: Standing   Face anchor, feet shoulder width apart. Palms up, pull arms back, squeezing shoulder blades together. Repeat 10__ times per set. Do 2-3__ sets per session. Do 1__ sessions per day Anchor Height: Waist   Strengthening: Resisted Extension   Hold tubing in right hand, arm forward. Pull arm back, elbow straight. Repeat _10___ times per set. Do 2-3____ sets per session. Do 1___ sessions per day.   Back Wall Slide    With feet __several__ inches from wall, lean as much of back against the wall as possible. Gently squat down, keeping back against wall. Hold __10__ seconds while counting out loud. Repeat __10__ times. Do _1-2___ sessions per day.

## 2016-09-05 NOTE — Progress Notes (Signed)
   Subjective:    Patient ID: Kimberly Knight, female    DOB: 03-23-1985, 31 y.o.   MRN: [id number]  HPI   31 yo female comes in today complaining of sinus symptoms. She has had 3 back-to-back colds and feels like just his initial give her one she gets sick again. For about a week she's actually had persistent symptoms with sinus congestion postnasal drip. No fevers chills or sweats. No sore throat. Over the past 2 days she's had more intense pain in the feeling of swelling on the right side of facial cheek. She is just starting to fill more fatigued and tired. She's been using over-the-counter cough cold medications.   Review of Systems     Objective:   Physical Exam  Constitutional: She is oriented to person, place, and time. She appears well-developed and well-nourished.  HENT:  Head: Normocephalic and atraumatic.  Right Ear: External ear normal.  Left Ear: External ear normal.  Nose: Nose normal.  Mouth/Throat: Oropharynx is clear and moist.  TMs and canals are clear.   Eyes: Conjunctivae and EOM are normal. Pupils are equal, round, and reactive to light.  Neck: Neck supple. No thyromegaly present.  Cardiovascular: Normal rate, regular rhythm and normal heart sounds.   Pulmonary/Chest: Effort normal and breath sounds normal. She has no wheezes.  Lymphadenopathy:    She has no cervical adenopathy.  Neurological: She is alert and oriented to person, place, and time.  Skin: Skin is warm and dry.  Psychiatric: She has a normal mood and affect.          Assessment & Plan:  Acute right maxillary sinusitis-we'll treat with Augmentin. If not significantly better in one week call back. Recommend the nasal sinus saline sinus rinse. Okay to use over-the-counter cough cold medication.

## 2016-09-05 NOTE — Therapy (Addendum)
Kimberly Knight, [place], [address] Phone: [phone]   Fax:  [phone]  Physical Therapy Treatment  Patient Details  Name: Kimberly Knight MRN: [id number] Date of Birth: 08/23/1985 Referring Provider: Dr Madilyn Fireman  Encounter Date: 09/05/2016      PT End of Session - 09/05/16 1642    Visit Number 2   Number of Visits 12   Date for PT Re-Evaluation 09/10/16   PT Start Time 1600   PT Stop Time 1657   PT Time Calculation (min) 57 min   Activity Tolerance Patient tolerated treatment well      History reviewed. No pertinent past medical history.  Past Surgical History:  Procedure Laterality Date  . hemangioma removal 1995      There were no vitals filed for this visit.      Subjective Assessment - 09/05/16 1703    Subjective Kimberly Knight reports good improvement in symptoms. She has continued to work on her exercises as she has had time. She feels she is gaining flexibility. She has returned to most of her functional activities. She still can't lift her newborn out of the crib without LBP and feeling of weakness.    Currently in Pain? No/denies            [facility] PT Assessment - 09/05/16 0001      Assessment   Medical Diagnosis Lumbar radiculopathy; piriformis syndrome    Referring Provider Dr Madilyn Fireman   Onset Date/Surgical Date 07/15/16   Hand Dominance Right   Next MD Visit as needed    Prior Therapy none      AROM   Lumbar Flexion 80%   Lumbar Extension 25%   Lumbar - Right Side Bend 75%   Lumbar - Left Side Bend 80%   Lumbar - Right Rotation 50%   Lumbar - Left Rotation 50%     Flexibility   Hamstrings tight Rt 70 deg; Lt 65 deg      Palpation   Spinal mobility hypomobile lumbar spine pain with CPA mobs L4/5 area    Palpation comment tight Lt/Rt piriformis/glut med                      [facility] Adult PT Treatment/Exercise - 09/05/16 0001      Lumbar Exercises:  Stretches   Passive Hamstring Stretch 2 reps;30 seconds  supine with strap    Press Ups 5 reps  1-2 sec   Quad Stretch 2 reps;30 seconds  prone with strap    Quad Stretch Limitations sitting hip flexor stretch 30 sec x 2 each side    Piriformis Stretch 5 reps;30 seconds  supine travell - varying angles      Lumbar Exercises: Standing   Wall Slides 10 reps;5 seconds  core engaged; with therapy ball    Row Strengthening;Both;10 reps  core engaged   Theraband Level (Row) Level 4 (Blue)   Shoulder Extension Strengthening;Both;10 reps  core engaged    Theraband Level (Shoulder Extension) Level 4 (Blue)   Other Standing Lumbar Exercises UE press down core engaged      Lumbar Exercises: Supine   Ab Set --  3 part core 10 sec x 10    Bridge 10 reps  5 sec hold with core engaged      Moist Heat Therapy   Number Minutes Moist Heat 20 Minutes   Moist Heat Location Lumbar Spine;Hip  Lt      Electrical Stimulation  Electrical Stimulation Location Lt lumbar to Lt piriformis    Electrical Stimulation Action IFC   Electrical Stimulation Parameters to tolerance   Electrical Stimulation Goals Pain;Tone     Manual Therapy   Manual therapy comments patient prone    Joint Mobilization Grade II    Soft tissue mobilization Lt/Rt posterior hip through the piriformis and glut medius    Myofascial Release Lt/Rt posterior hip      Elliptical L2 x 5 min            PT Education - 09/05/16 1647    Education provided Yes   Education Details HEP    Person(s) Educated Patient   Methods Explanation;Demonstration;Tactile cues;Verbal cues;Handout   Comprehension Verbalized understanding;Returned demonstration;Verbal cues required;Tactile cues required             PT Long Term Goals - 09/05/16 1641      PT LONG TERM GOAL #1   Title Improve tissue extensibility in trunk and LE with patient to demonstrate HS flexibilty to ~80 deg bilat 09/10/16   Time 6   Period Weeks   Status  On-going     PT LONG TERM GOAL #2   Title Improve core stability with patient to demonstrate core strengthening program with good contraction of core 09/10/16   Time 6   Period Weeks   Status On-going     PT LONG TERM GOAL #3   Title Improve tolerance for functional activities allowing patient to return to normal childcare and household activities including lifting 33 year old without pain 09/10/16   Time 6   Period Weeks   Status Achieved     PT LONG TERM GOAL #4   Title Independent in HEP 09/10/16   Time 6   Period Weeks   Status On-going     PT LONG TERM GOAL #5   Title Improve FOTO to </= 23% limtiation 09/10/16   Time 6   Period Weeks   Status On-going               Plan - 09/05/16 1655    Clinical Impression Statement Kimberly Knight presents with resolving LBP and bilat hip pain. She demonstrates increasing trunk and LE mobilty. There is less muscular tightness to palpation through lumbar and bilat hips. Pain has decreased and Kimberly Knight has returned to normal functional activities with a few exceptions. She has worked on her exercises as she could with her newborn son in the home now. Added exercises today without difficulty.    Rehab Potential Good   PT Frequency 2x / week   PT Duration 6 weeks   PT Treatment/Interventions Patient/family education;ADLs/Self Care Home Management;Cryotherapy;Electrical Stimulation;Iontophoresis 4mg /ml Dexamethasone;Moist Heat;Traction;Ultrasound;Dry needling;Manual techniques;Therapeutic activities;Therapeutic exercise;Neuromuscular re-education   PT Next Visit Plan continue DN and manual work Lt piriformis/hip abductors as indicated; sitting hip flexor stretch; core stabilization; modalities as indicated deep tissue work spinal mobs core exercise    Consulted and Agree with Plan of Care Patient      Patient will benefit from skilled therapeutic intervention in order to improve the following deficits and impairments:  Postural dysfunction, Improper  body mechanics, Pain, Decreased range of motion, Decreased mobility, Decreased strength, Increased fascial restricitons, Increased muscle spasms, Decreased activity tolerance  Visit Diagnosis: Other symptoms and signs involving the musculoskeletal system  Acute left-sided low back pain, with sciatica presence unspecified     Problem List Patient Active Problem List   Diagnosis Date Noted  . Strain of left quadriceps 06/24/2014  . Sacroiliac  joint dysfunction of right side 01/15/2014    Kimberly Knight Kimberly Knight PT, MPH  09/05/2016, 5:06 PM  [facility] [place], [place], [address] Phone: [phone]   Fax:  [phone]  Name: Kimberly Knight MRN: [id number] Date of Birth: [birth year]

## 2016-09-07 ENCOUNTER — Ambulatory Visit (INDEPENDENT_AMBULATORY_CARE_PROVIDER_SITE_OTHER): Payer: 59 | Admitting: Physical Therapy

## 2016-09-07 DIAGNOSIS — M545 Low back pain: Secondary | ICD-10-CM

## 2016-09-07 DIAGNOSIS — R29898 Other symptoms and signs involving the musculoskeletal system: Secondary | ICD-10-CM

## 2016-09-07 NOTE — Therapy (Addendum)
[place], [place], [address] Phone: [phone]   Fax:  [phone]  Physical Therapy Treatment  Patient Details  Name: Kimberly Knight MRN: [id number] Date of Birth: Mar 22, 1985 Referring Provider: Dr Madilyn Fireman  Encounter Date: 09/07/2016      PT End of Session - 09/07/16 1420    Visit Number 3   Number of Visits 12   Date for PT Re-Evaluation 09/10/16   PT Start Time 1330   PT Stop Time 1421   PT Time Calculation (min) 51 min   Activity Tolerance Patient tolerated treatment well   Behavior During Therapy [facility] for tasks assessed/performed      No past medical history on file.  Past Surgical History:  Procedure Laterality Date  . hemangioma removal 1995      There were no vitals filed for this visit.      Subjective Assessment - 09/07/16 1350    Subjective Samaria is not feeling any pain today. She says that it is getting easier to bend forward, but she is still afraid of muscles locking up when she makes a sudden movement.   Pertinent History history of LP for the past 2 years   How long can you sit comfortably? 20-30 min    How long can you stand comfortably? 30-60 min    How long can you walk comfortably? no limit - best with walking    Diagnostic tests xrays   Patient Stated Goals get rid of pain; strengthening core    Currently in Pain? No/denies            [facility] PT Assessment - 09/07/16 0001      Assessment   Medical Diagnosis Lumbar radiculopathy; piriformis syndrome    Referring Provider Dr Madilyn Fireman   Onset Date/Surgical Date 07/15/16   Hand Dominance Right   Next MD Visit as needed    Prior Therapy none      ROM / Strength   AROM / PROM / Strength AROM     AROM   AROM Assessment Site Hip   Right/Left Hip Right;Left - after piriformis stretch    Right Hip External Rotation  72  hooklying, fig 4   Left Hip External Rotation  65  hooklying, fig 4           [facility] Adult PT Treatment/Exercise - 09/07/16 0001      Lumbar Exercises: Stretches   Passive Hamstring Stretch 2 reps;30 seconds  supine with strap, right; left   Piriformis Stretch 30 seconds;2 reps  Right; left     Lumbar Exercises: Standing   Other Standing Lumbar Exercises IN SITTING; core isometric with prayer hands; downward press;  anti-rotation left; anti-rotation right  - 5-10 sec x 2 reps each direction.     Lumbar Exercises: Supine   Ab Set --  2 reps with TC for proper form   Clam 10 reps  with core engaged   Bent Knee Raise 10 reps  with core engaged   Bridge 10 reps;5 seconds  strap at thighs, with abduction   Straight Leg Raises Limitations --     Moist Heat Therapy   Number Minutes Moist Heat 15 Minutes   Moist Heat Location Lumbar Spine;Hip  (bilat)     Electrical Stimulation   Electrical Stimulation Location bilat lumbar and SI joint   Electrical Stimulation Action IFC   Electrical Stimulation Parameters to tolerance   Electrical Stimulation Goals Pain;Tone  Manual Therapy   Soft tissue mobilization Lt/Rt posterior hip through the piriformis and glut medius  - point tender in Lt piriformis.           PT Long Term Goals - 09/05/16 1641      PT LONG TERM GOAL #1   Title Improve tissue extensibility in trunk and LE with patient to demonstrate HS flexibilty to ~80 deg bilat 09/10/16   Time 6   Period Weeks   Status On-going     PT LONG TERM GOAL #2   Title Improve core stability with patient to demonstrate core strengthening program with good contraction of core 09/10/16   Time 6   Period Weeks   Status On-going     PT LONG TERM GOAL #3   Title Improve tolerance for functional activities allowing patient to return to normal childcare and household activities including lifting 33 year old without pain 09/10/16   Time 6   Period Weeks   Status Achieved     PT LONG TERM GOAL #4   Title Independent in HEP 09/10/16   Time 6   Period Weeks    Status On-going     PT LONG TERM GOAL #5   Title Improve FOTO to </= 23% limtiation 09/10/16   Time 6   Period Weeks   Status On-going               Plan - 09/07/16 1507    Clinical Impression Statement Marieann had positive response to last session with manual therapy. She is reporting reduced pain with activities like lifting infant.  Will benefit from further strengthening of her core, hip flexors and abductors. Dayle is performing exercises at home. Pt tolerated treatment well with min to no increase in pain.     Rehab Potential Good   PT Frequency 2x / week   PT Duration 6 weeks   PT Treatment/Interventions Patient/family education;ADLs/Self Care Home Management;Cryotherapy;Electrical Stimulation;Iontophoresis 4mg /ml Dexamethasone;Moist Heat;Traction;Ultrasound;Dry needling;Manual techniques;Therapeutic activities;Therapeutic exercise;Neuromuscular re-education   PT Next Visit Plan Continue manual work to Lt piriformis/hip abductors as indicated; stretch hip flexors; strengthen core; modalities as indicated   Consulted and Agree with Plan of Care Patient      Patient will benefit from skilled therapeutic intervention in order to improve the following deficits and impairments:  Postural dysfunction, Improper body mechanics, Pain, Decreased range of motion, Decreased mobility, Decreased strength, Increased fascial restricitons, Increased muscle spasms, Decreased activity tolerance  Visit Diagnosis: Other symptoms and signs involving the musculoskeletal system  Acute left-sided low back pain, with sciatica presence unspecified     Problem List Patient Active Problem List   Diagnosis Date Noted  . Strain of left quadriceps 06/24/2014  . Sacroiliac joint dysfunction of right side 01/15/2014   Kerin Perna, PTA 09/07/16 4:13 PM During this treatment session, the therapist was present, participating in and directing the treatment.   Andria Meuse, [place] 09/07/2016,  3:26 PM    [facility] [place], [place], [address] Phone: [phone]   Fax:  [phone]  Name: RONEKA GILPIN MRN: [id number] Date of Birth: [birth year]

## 2016-09-10 ENCOUNTER — Encounter: Payer: Self-pay | Admitting: Rehabilitative and Restorative Service Providers"

## 2016-09-10 ENCOUNTER — Ambulatory Visit (INDEPENDENT_AMBULATORY_CARE_PROVIDER_SITE_OTHER): Payer: 59 | Admitting: Rehabilitative and Restorative Service Providers"

## 2016-09-10 DIAGNOSIS — M545 Low back pain: Secondary | ICD-10-CM | POA: Diagnosis not present

## 2016-09-10 DIAGNOSIS — R29898 Other symptoms and signs involving the musculoskeletal system: Secondary | ICD-10-CM

## 2016-09-10 NOTE — Therapy (Addendum)
[place], [place], [address] Phone: [phone]   Fax:  [phone]  Physical Therapy Treatment  Patient Details  Name: Kimberly Knight MRN: [id number] Date of Birth: 1985-10-15 Referring Provider: Dr Madilyn Fireman  Encounter Date: 09/10/2016      PT End of Session - 09/10/16 1522    Visit Number 4   Number of Visits 12   Date for PT Re-Evaluation 09/10/16   PT Start Time 6389   PT Stop Time 3734   PT Time Calculation (min) 58 min   Activity Tolerance Patient tolerated treatment well      History reviewed. No pertinent past medical history.  Past Surgical History:  Procedure Laterality Date  . hemangioma removal 1995      There were no vitals filed for this visit.      Subjective Assessment - 09/10/16 1523    Subjective Feeling much better. Has had no episodes of weakness and only a couple of times when she had grabbing pain in the Rt posterior hip running to mid thigh. She stretched and this subsided in ~ 5 min    Currently in Pain? No/denies            [facility] PT Assessment - 09/10/16 0001      Assessment   Medical Diagnosis Lumbar radiculopathy; piriformis syndrome    Referring Provider Dr Madilyn Fireman   Onset Date/Surgical Date 07/15/16   Hand Dominance Right   Next MD Visit as needed    Prior Therapy none      Flexibility   Hamstrings tight Rt 80 deg; Lt 75 deg      Palpation   Spinal mobility hypomobile lumbar spine pain with CPA mobs L4/5 area    Palpation comment tight Rt > Lt piriformis/glut med                      [facility] Adult PT Treatment/Exercise - 09/10/16 0001      Lumbar Exercises: Stretches   Passive Hamstring Stretch 2 reps;30 seconds  supine with strap, right; left   Piriformis Stretch 30 seconds;2 reps  Right; left     Lumbar Exercises: Aerobic   Elliptical L2 x 5 min      Lumbar Exercises: Supine   Clam 10 reps  with core engaged one LE  stationary moving opposite    Bridge 10 reps;5 seconds  blue TB at thighs, with abduction     Lumbar Exercises: Quadruped   [place]/Old Horse 5 reps  core engaged    Single Arm Raise 5 reps  core engaged   Straight Leg Raise 5 reps  core engaged      Moist Heat Therapy   Number Minutes Moist Heat 20 Minutes   Moist Heat Location Lumbar Spine;Hip  Lt      Acupuncturist Location Lt lumbar to Lt piriformis    Electrical Stimulation Action IFC   Electrical Stimulation Parameters to tolerance   Electrical Stimulation Goals Pain;Tone     Manual Therapy   Manual therapy comments patient prone    Joint Mobilization Grade II    Soft tissue mobilization Lt/Rt posterior hip through the piriformis and glut medius - tighter Rt    Myofascial Release Lt/Rt posterior hip                      PT Long Term Goals - 09/10/16 1523  PT LONG TERM GOAL #1   Title Improve tissue extensibility in trunk and LE with patient to demonstrate HS flexibilty to ~80 deg bilat 09/10/16   Time 6   Period Weeks   Status On-going     PT LONG TERM GOAL #2   Title Improve core stability with patient to demonstrate core strengthening program with good contraction of core 09/10/16   Time 6   Period Weeks   Status On-going     PT LONG TERM GOAL #3   Title Improve tolerance for functional activities allowing patient to return to normal childcare and household activities including lifting 33 year old without pain 09/10/16   Time 6   Period Weeks   Status On-going     PT LONG TERM GOAL #4   Title Independent in HEP 09/10/16   Time 6   Period Weeks   Status On-going     PT LONG TERM GOAL #5   Title Improve FOTO to </= 23% limtiation 09/10/16   Time 6   Period Weeks   Status On-going               Plan - 09/10/16 1600    Clinical Impression Statement Continued progress with decresaed frequency and intensity of pain; improving mobility; improving  functional activity level(lifting baby with much less pain); feeling better. Patient has less palpable tightness through the hip abductors and piriformis; improving tissue extensibility and mobility; increasing functional activity level. Progressing well with rehab.    Rehab Potential Good   PT Frequency 2x / week   PT Duration 6 weeks   PT Treatment/Interventions Patient/family education;ADLs/Self Care Home Management;Cryotherapy;Electrical Stimulation;Iontophoresis 27m/ml Dexamethasone;Moist Heat;Traction;Ultrasound;Dry needling;Manual techniques;Therapeutic activities;Therapeutic exercise;Neuromuscular re-education   PT Next Visit Plan Continue manual work to Lt piriformis/hip abductors as indicated; stretch hip flexors; strengthen core; modalities as indicated - progress core stabilization in clinic with fewer HEP due to busy lifestyle with new baby. Will allow Maddisen to focus on core stabilization that works well with her schedule and stretching as she can fit into her schedule.    Consulted and Agree with Plan of Care Patient      Patient will benefit from skilled therapeutic intervention in order to improve the following deficits and impairments:  Postural dysfunction, Improper body mechanics, Pain, Decreased range of motion, Decreased mobility, Decreased strength, Increased fascial restricitons, Increased muscle spasms, Decreased activity tolerance  Visit Diagnosis: Other symptoms and signs involving the musculoskeletal system  Acute left-sided low back pain, with sciatica presence unspecified     Problem List Patient Active Problem List   Diagnosis Date Noted  . Strain of left quadriceps 06/24/2014  . Sacroiliac joint dysfunction of right side 01/15/2014    Celyn PNilda SimmerPT, MPH  09/10/2016, 4:05 PM  [facility] [address] [address]Phone: [phone]  Fax:  [phone] Name: Kimberly STEINBACHMRN:  [id number] of Birth: 608-13-1987 PHYSICAL THERAPY DISCHARGE SUMMARY  Visits from Start of Care: 4  Current functional level related to goals / functional outcomes: See last progress note for discharge status    Remaining deficits: Unknown    Education / Equipment: HEP Plan: Patient agrees to discharge.  Patient goals were partially met. Patient is being discharged due to being pleased with the current functional level.  ?????    Celyn P. HHelene KelpPT, MPH 11/16/16 3:06 PM

## 2016-09-13 ENCOUNTER — Encounter: Payer: 59 | Admitting: Physical Therapy

## 2016-11-07 ENCOUNTER — Encounter: Payer: Self-pay | Admitting: Family Medicine

## 2016-11-07 ENCOUNTER — Ambulatory Visit (INDEPENDENT_AMBULATORY_CARE_PROVIDER_SITE_OTHER): Payer: 59 | Admitting: Family Medicine

## 2016-11-07 VITALS — BP 113/66 | HR 79 | Wt 190.0 lb

## 2016-11-07 DIAGNOSIS — Z800 Family history of malignant neoplasm of digestive organs: Secondary | ICD-10-CM

## 2016-11-07 DIAGNOSIS — L659 Nonscarring hair loss, unspecified: Secondary | ICD-10-CM | POA: Diagnosis not present

## 2016-11-07 DIAGNOSIS — Z803 Family history of malignant neoplasm of breast: Secondary | ICD-10-CM | POA: Diagnosis not present

## 2016-11-07 DIAGNOSIS — F430 Acute stress reaction: Secondary | ICD-10-CM

## 2016-11-07 DIAGNOSIS — Z808 Family history of malignant neoplasm of other organs or systems: Secondary | ICD-10-CM

## 2016-11-07 MED ORDER — BUPROPION HCL ER (XL) 150 MG PO TB24: 150.0000 mg | ORAL_TABLET | ORAL | 5 refills | Status: DC

## 2016-11-07 MED FILL — buPROPion HCL ER (XL) 150 M: 150 | 30 days supply | Qty: 30 | Fill #0

## 2016-11-07 NOTE — Progress Notes (Signed)
Subjective:    Patient ID: Kimberly Knight, female    DOB: Mar 16, 1985, 31 y.o.   MRN: [id number]  HPI 31 year old female comes in today to discuss possible genetic testing. Her father has a family history of pancreatic cancer at age 54. Fortunately he underwent a Whipple procedure and is doing well years later. Her mother has a history of melanoma Diagnosed at 69 and Breast Cancer diagnosed at 84 .her maternal grandmother was diagnosed with breast cancer at 67 and colon cancer at 62.she has never had any personal history of cancer or breast problems.   She also wanted to her mood as well. She wouldl ike to go back on Wellbutrin if possible. She was on it for about 4 months for postpartum depression and did well.  No S.E. On the medication. She has just been very stress. She found out that the pastor of her church may be stepping down and they may want her husband who is the [employer] to step it.  She knows this is very stressful and relied on her husband a lot ot help with the kids.  She is not depressed but just feels like a heavniess in on her as things may change in her life. She also has the stress of work as well. She has felt some stressed and feels herself stress eating more. She sleeps well and has lots of energy.she's also noticed that she is having more difficulty focusing. She's having to redo things because of this. She feels like the Wellbutrin would help curb her appetite as well    Review of Systems   She's also lists a little bit more hair loss than usual. She would like to be checked for nutritional deficiencies.  BP 113/66   Pulse 79   Wt 190 lb (86.2 kg)   SpO2 100%   BMI 24.39 kg/m     No Known Allergies  No past medical history on file.  Past Surgical History:  Procedure Laterality Date  . hemangioma removal 1995      Social History   Social History  . Marital status: Married    Spouse name: N/A  . Number of children: N/A  . Years of education: N/A    Occupational History  . Not on file.   Social History Main Topics  . Smoking status: Never Smoker  . Smokeless tobacco: Never Used  . Alcohol use Yes     Comment: rare  . Drug use: No  . Sexual activity: Yes   Other Topics Concern  . Not on file   Social History Narrative  . No narrative on file    Family History  Problem Relation Age of Onset  . Cancer Mother        breast, skin  . Cancer Father        Pancreatic   . Hypertension Father     Outpatient Encounter Prescriptions as of 11/07/2016  Medication Sig  . buPROPion (WELLBUTRIN XL) 150 MG 24 hr tablet Take 1 tablet (150 mg total) by mouth every morning.  . [DISCONTINUED] amoxicillin-clavulanate (AUGMENTIN) 875-125 MG tablet Take 1 tablet by mouth 2 (two) times daily.  . [DISCONTINUED] cyclobenzaprine (FLEXERIL) 10 MG tablet Take 0.5-1 tablets (5-10 mg total) by mouth at bedtime as needed for muscle spasms.  . [DISCONTINUED] ibuprofen (ADVIL,MOTRIN) 600 MG tablet Take 1 tablet (600 mg total) by mouth every 6 (six) hours.   No facility-administered encounter medications on file as of 11/07/2016.  Objective:   Physical Exam  Constitutional: She is oriented to person, place, and time. She appears well-developed and well-nourished.  HENT:  Head: Normocephalic and atraumatic.  Eyes: Conjunctivae and EOM are normal.  Cardiovascular: Normal rate.   Pulmonary/Chest: Effort normal.  Neurological: She is alert and oriented to person, place, and time.  Skin: Skin is dry. No pallor.  Psychiatric: She has a normal mood and affect. Her behavior is normal.  Vitals reviewed.         Assessment & Plan:  High risk for possible cancer-recommend genetic testing..  Will test through [facility]. Blood drawn today.   Stress reaction - will start Wellbutrin. Follow-up in 2-3 months. Call if any concerns or worsening of symptoms. PHQ 2 score of 0 and GAD 7 score of 12   

## 2016-11-08 ENCOUNTER — Ambulatory Visit: Payer: 59 | Admitting: Family Medicine

## 2016-11-08 DIAGNOSIS — Z800 Family history of malignant neoplasm of digestive organs: Secondary | ICD-10-CM | POA: Diagnosis not present

## 2016-11-08 DIAGNOSIS — Z808 Family history of malignant neoplasm of other organs or systems: Secondary | ICD-10-CM | POA: Diagnosis not present

## 2016-11-08 DIAGNOSIS — Z803 Family history of malignant neoplasm of breast: Secondary | ICD-10-CM | POA: Diagnosis not present

## 2016-11-21 ENCOUNTER — Encounter: Payer: Self-pay | Admitting: Family Medicine

## 2016-11-21 DIAGNOSIS — Z808 Family history of malignant neoplasm of other organs or systems: Secondary | ICD-10-CM | POA: Insufficient documentation

## 2016-11-21 DIAGNOSIS — Z803 Family history of malignant neoplasm of breast: Secondary | ICD-10-CM | POA: Insufficient documentation

## 2016-11-22 DIAGNOSIS — H5213 Myopia, bilateral: Secondary | ICD-10-CM | POA: Diagnosis not present

## 2016-11-22 DIAGNOSIS — Z135 Encounter for screening for eye and ear disorders: Secondary | ICD-10-CM | POA: Diagnosis not present

## 2016-12-17 ENCOUNTER — Encounter: Payer: Self-pay | Admitting: Family Medicine

## 2016-12-17 ENCOUNTER — Ambulatory Visit (INDEPENDENT_AMBULATORY_CARE_PROVIDER_SITE_OTHER): Payer: 59 | Admitting: Family Medicine

## 2016-12-17 VITALS — BP 121/58 | HR 75

## 2016-12-17 DIAGNOSIS — N760 Acute vaginitis: Secondary | ICD-10-CM | POA: Diagnosis not present

## 2016-12-17 NOTE — Progress Notes (Signed)
   Subjective:    Patient ID: Kimberly Knight, female    DOB: [birth year], 31 y.o.   MRN: [id number]  HPI 31 year old female just returned from the beach and is started experiencing some vaginal irritation and itching.  She had similar symptoms back in August and was treated for a yeast infection. Last pap smear 08/06/2016 was normal. She also has URI sxs.    Review of Systems     Objective:   Physical Exam        Assessment & Plan:  Vaginitis -wet prep performed.will call with results when available.

## 2016-12-18 ENCOUNTER — Other Ambulatory Visit: Payer: Self-pay | Admitting: Family Medicine

## 2016-12-18 LAB — WET PREP FOR TRICH, YEAST, CLUE
MICRO NUMBER: 81178302
SPECIMEN QUALITY 3963: ADEQUATE

## 2016-12-18 MED ORDER — METRONIDAZOLE 500 MG PO TABS: 500.0000 mg | ORAL_TABLET | Freq: Two times a day (BID) | ORAL | 0 refills | Status: DC

## 2016-12-28 ENCOUNTER — Ambulatory Visit (INDEPENDENT_AMBULATORY_CARE_PROVIDER_SITE_OTHER): Payer: 59 | Admitting: Family Medicine

## 2016-12-28 ENCOUNTER — Encounter: Payer: Self-pay | Admitting: Family Medicine

## 2016-12-28 VITALS — BP 104/64 | HR 78

## 2016-12-28 DIAGNOSIS — B3731 Acute candidiasis of vulva and vagina: Secondary | ICD-10-CM

## 2016-12-28 DIAGNOSIS — R300 Dysuria: Secondary | ICD-10-CM

## 2016-12-28 DIAGNOSIS — B373 Candidiasis of vulva and vagina: Secondary | ICD-10-CM

## 2016-12-28 LAB — POCT URINALYSIS DIPSTICK
BILIRUBIN UA: NEGATIVE
GLUCOSE UA: NEGATIVE
Leukocytes, UA: NEGATIVE
Nitrite, UA: NEGATIVE
Protein, UA: NEGATIVE
RBC UA: NEGATIVE
Urobilinogen, UA: 0.2 E.U./dL
pH, UA: 5.5 (ref 5.0–8.0)

## 2016-12-28 MED ORDER — CIPROFLOXACIN HCL 500 MG PO TABS: 500.0000 mg | ORAL_TABLET | Freq: Two times a day (BID) | ORAL | 0 refills | Status: AC

## 2016-12-28 MED ORDER — FLUCONAZOLE 150 MG PO TABS: 150.0000 mg | ORAL_TABLET | Freq: Once | ORAL | 0 refills | Status: AC

## 2016-12-28 NOTE — Progress Notes (Signed)
   Subjective:    Patient ID: Kimberly Knight, female    DOB: [birth year], 31 y.o.   MRN: [id number]  HPI 31 yo female recently treated for BV. She is still having some persistant sxs. She has urinary frequency and dysuria.  No vaginal d/c but feels sticky.  No fever, chills, etc.    Review of Systems     Objective:   Physical Exam        Assessment & Plan:  Dysuria - UA is negative.  Will cover with Cipro since sxs seem consistant with UTI.  Drink plenty of water.   Vaginitis - Wet prep pos for yeast will tx with diflucan.  Call if not better in 3-4 days.

## 2016-12-29 LAB — WET PREP FOR TRICH, YEAST, CLUE
MICRO NUMBER: 81233249
Specimen Quality: ADEQUATE

## 2017-01-01 ENCOUNTER — Ambulatory Visit (INDEPENDENT_AMBULATORY_CARE_PROVIDER_SITE_OTHER): Payer: 59 | Admitting: Family Medicine

## 2017-01-01 VITALS — BP 105/62 | HR 109 | Temp 99.0°F

## 2017-01-01 DIAGNOSIS — B348 Other viral infections of unspecified site: Secondary | ICD-10-CM

## 2017-01-01 DIAGNOSIS — R059 Cough, unspecified: Secondary | ICD-10-CM

## 2017-01-01 DIAGNOSIS — J029 Acute pharyngitis, unspecified: Secondary | ICD-10-CM | POA: Diagnosis not present

## 2017-01-01 DIAGNOSIS — R05 Cough: Secondary | ICD-10-CM

## 2017-01-01 DIAGNOSIS — B338 Other specified viral diseases: Secondary | ICD-10-CM

## 2017-01-01 DIAGNOSIS — R52 Pain, unspecified: Secondary | ICD-10-CM | POA: Diagnosis not present

## 2017-01-01 DIAGNOSIS — R509 Fever, unspecified: Secondary | ICD-10-CM

## 2017-01-01 DIAGNOSIS — R6883 Chills (without fever): Secondary | ICD-10-CM

## 2017-01-01 LAB — POCT RAPID STREP A (OFFICE): RAPID STREP A SCREEN: NEGATIVE

## 2017-01-01 LAB — POCT INFLUENZA A/B
INFLUENZA A, POC: NEGATIVE
INFLUENZA B, POC: NEGATIVE

## 2017-01-01 NOTE — Progress Notes (Signed)
   Subjective:    Patient ID: Kimberly Knight, female    DOB: [birth year], 31 y.o.   MRN: [id number]  HPI 31 year old female comes in today complaining of fever for about 2-1/2 days.  Starting on Saturday she had a little bit of a mild sore throat and headache.  She then ran a low-grade temp that evening and thought maybe she was just getting a virus.  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  By the next day her symptoms were more severe and she had diffuse body aches particularly in the low back and hips.  Last night on Monday she had a fever of 102.5 in the early evening still with severe body aches.  I spoke to her last evening and actually called her in Tamiflu.  She has not had any runny nose or cough symptoms.  Is having chills and sweats as well.  Alternating Tylenol and ibuprofen to keep the fever down.  As soon as it would wear off she would start to feel bad again.  She had significant nausea but no vomiting.  Yesterday she felt so weak she could barely get out of bed.   Review of Systems     Objective:   Physical Exam  Constitutional: She is oriented to person, place, and time. She appears well-developed and well-nourished.  HENT:  Head: Normocephalic and atraumatic.  Right Ear: External ear normal.  Left Ear: External ear normal.  Nose: Nose normal.  Mouth/Throat: Oropharynx is clear and moist.  TMs and canals are clear. OP with significant erythema and white spots bilaterally.   Eyes: Conjunctivae and EOM are normal. Pupils are equal, round, and reactive to light.  Neck: Neck supple. No thyromegaly present.  Cardiovascular: Normal rate, regular rhythm and normal heart sounds.  Pulmonary/Chest: Effort normal and breath sounds normal. She has no wheezes.  Lymphadenopathy:    She has no cervical adenopathy.  Neurological: She is alert and oriented to person, place, and time.  Skin: Skin is warm and dry.   Psychiatric: She has a normal mood and affect.          Assessment & Plan:  Parainfluenza -rapid flu test was negative as well as strep test.  Likely viral.  She is feeling a little bit better today and is afebrile.  We will have her go ahead and complete Tamiflu.  We were unable to get a good swab of the throat so if the sore throat continues we will plan to do a throat culture for strep.  Encourage hydration today as much as possible.  Okay to return to work once fever free for 24 hours.

## 2017-01-01 NOTE — Progress Notes (Signed)
Pt in today for flu & strep swab. Strep is negative but I was unable to get a really good sample due to pt gagging and vomiting from being swabbed. Flu is also negative.

## 2017-01-03 ENCOUNTER — Ambulatory Visit (INDEPENDENT_AMBULATORY_CARE_PROVIDER_SITE_OTHER): Payer: 59 | Admitting: Family Medicine

## 2017-01-03 VITALS — BP 113/70 | HR 82 | Temp 97.6°F

## 2017-01-03 DIAGNOSIS — J020 Streptococcal pharyngitis: Secondary | ICD-10-CM | POA: Diagnosis not present

## 2017-01-03 DIAGNOSIS — J029 Acute pharyngitis, unspecified: Secondary | ICD-10-CM

## 2017-01-03 LAB — POCT RAPID STREP A (OFFICE): Rapid Strep A Screen: POSITIVE — AB

## 2017-01-03 MED ORDER — PENICILLIN G BENZATHINE & PROC 900000-300000 UNIT/2ML IM SUSP
1.2000 10*6.[IU] | Freq: Once | INTRAMUSCULAR | Status: AC
  Administered 2017-01-03: 1.2 10*6.[IU] via INTRAMUSCULAR

## 2017-01-03 MED ORDER — FLUCONAZOLE 150 MG PO TABS: 150.0000 mg | ORAL_TABLET | Freq: Every day | ORAL | 2 refills | Status: DC

## 2017-01-03 NOTE — Progress Notes (Signed)
   Subjective:    Patient ID: Kimberly Knight, female    DOB: 1985-11-14, 31 y.o.   MRN: [id number]  HPI 31 year old female comes in today complaining that she is feeling much better overall in regards to fever and myalgias and fatigue but her throat is just extremely sore.  The lymph node particular in the left side of her neck is still very swollen and infection feels like her throat is almost getting worse.  It hurts just to turn her neck.   Review of Systems     Objective:   Physical Exam  Constitutional: She is oriented to person, place, and time. She appears well-developed and well-nourished.  HENT:  Head: Normocephalic and atraumatic.  Oropharynx with beefy red appearance posteriorly with a white spot approximately a centimeter on the left tonsil.  Neurological: She is alert and oriented to person, place, and time.  Skin: Skin is warm. No rash noted.  Psychiatric: She has a normal mood and affect.          Assessment & Plan:  Pharyngitis-strep test positive for streptococcal pharyngitis.  Will treat with 1,200,000 units of penicillin IM.  Call if not better in 1 week.  Also I refilled Diflucan as she is worried she will get another yeast infection with the antibiotic dosing.

## 2017-02-08 ENCOUNTER — Encounter: Payer: Self-pay | Admitting: Family Medicine

## 2017-02-08 ENCOUNTER — Ambulatory Visit (INDEPENDENT_AMBULATORY_CARE_PROVIDER_SITE_OTHER): Payer: 59 | Admitting: Family Medicine

## 2017-02-08 VITALS — BP 118/61 | HR 83

## 2017-02-08 DIAGNOSIS — J0100 Acute maxillary sinusitis, unspecified: Secondary | ICD-10-CM

## 2017-02-08 MED ORDER — AMOXICILLIN-POT CLAVULANATE 875-125 MG PO TABS: 1.0000 | ORAL_TABLET | Freq: Two times a day (BID) | ORAL | 0 refills | Status: DC

## 2017-02-08 MED FILL — AMOX-CLAV 875-125 MG TABLET: 875-125 | 10 days supply | Qty: 20 | Fill #0

## 2017-02-08 NOTE — Patient Instructions (Addendum)

## 2017-02-08 NOTE — Progress Notes (Signed)
   Subjective:    Patient ID: Kimberly Knight, female    DOB: Nov 09, 1985, 31 y.o.   MRN: [id number]  HPI 31-year-old female comes in today complaining of sinus symptoms for approximately 2 weeks with nasal congestion, bilateral maxillary pressure though worse on the right compared to the left.  No sore throat.  No ear pain.  Just persistently congested without significant relief.  She has been taking some over-the-counter severe cold and flu medication which does provide temporary relief.  No fevers chills or sweats.   Review of Systems     Objective:   Physical Exam  Constitutional: She is oriented to person, place, and time. She appears well-developed and well-nourished.  HENT:  Head: Normocephalic and atraumatic.  Right Ear: External ear normal.  Left Ear: External ear normal.  Nose: Nose normal.  Mouth/Throat: Oropharynx is clear and moist.  TMs and canals are clear.  Tender over the right maxillary sinus.  Eyes: Conjunctivae and EOM are normal. Pupils are equal, round, and reactive to light.  Neck: Neck supple. No thyromegaly present.  Cardiovascular: Normal rate, regular rhythm and normal heart sounds.  Pulmonary/Chest: Effort normal and breath sounds normal. She has no wheezes.  Lymphadenopathy:    She has no cervical adenopathy.  Neurological: She is alert and oriented to person, place, and time.  Skin: Skin is warm and dry.  Psychiatric: She has a normal mood and affect.       Assessment & Plan:  Acute maxillary sinusitis-we will treat with Augmentin.  If not significantly better in 5-7 days then please give [place] a call back.  Make sure hydrating well.

## 2017-04-19 ENCOUNTER — Telehealth: Payer: Self-pay | Admitting: Family Medicine

## 2017-04-19 ENCOUNTER — Other Ambulatory Visit: Payer: Self-pay | Admitting: Family Medicine

## 2017-04-19 MED ORDER — OSELTAMIVIR PHOSPHATE 75 MG PO CAPS
75.0000 mg | ORAL_CAPSULE | Freq: Every day | ORAL | 0 refills | Status: DC
Start: 2017-04-19 — End: 2017-05-13

## 2017-04-19 NOTE — Telephone Encounter (Signed)
Son diagnosed with influenza A in our office today.  Prophylactic dose for Tamiflu sent to pharmacy.

## 2017-05-03 ENCOUNTER — Encounter: Payer: Self-pay | Admitting: Sports Medicine

## 2017-05-03 ENCOUNTER — Ambulatory Visit (INDEPENDENT_AMBULATORY_CARE_PROVIDER_SITE_OTHER): Payer: No Typology Code available for payment source | Admitting: Sports Medicine

## 2017-05-03 DIAGNOSIS — R635 Abnormal weight gain: Secondary | ICD-10-CM | POA: Insufficient documentation

## 2017-05-03 MED ORDER — PHENTERMINE HCL 37.5 MG PO TABS: ORAL_TABLET | ORAL | 0 refills | Status: DC

## 2017-05-03 MED ORDER — ACARBOSE 50 MG PO TABS: 50.0000 mg | ORAL_TABLET | Freq: Three times a day (TID) | ORAL | 3 refills | Status: DC

## 2017-05-03 MED ORDER — TOPIRAMATE 50 MG PO TABS: ORAL_TABLET | ORAL | 0 refills | Status: DC

## 2017-05-03 MED FILL — TOPIRAMATE 50 MG TABLET: 50 | 30 days supply | Qty: 30 | Fill #0

## 2017-05-03 MED FILL — ACARBOSE 50 MG TABLET: 50 | 30 days supply | Qty: 90 | Fill #0

## 2017-05-03 MED FILL — PHENTERMINE 37.5 MG TABLET: 37.5 | 30 days supply | Qty: 30 | Fill #0

## 2017-05-03 NOTE — Progress Notes (Signed)
  Subjective:    CC: weight gain  HPI: Kimberly Knight sat on a table today and it collapsed, time to start weight loss meds.  I reviewed the past medical history, family history, social history, surgical history, and allergies today and no changes were needed.  Please see the problem list section below in epic for further details.  Past Medical History: No past medical history on file. Past Surgical History: Past Surgical History:  Procedure Laterality Date  . hemangioma removal 1995     Social History: Social History   Socioeconomic History  . Marital status: Married    Spouse name: Not on file  . Number of children: Not on file  . Years of education: Not on file  . Highest education level: Not on file  Social Needs  . Financial resource strain: Not on file  . Food insecurity - worry: Not on file  . Food insecurity - inability: Not on file  . Transportation needs - medical: Not on file  . Transportation needs - non-medical: Not on file  Occupational History  . Not on file  Tobacco Use  . Smoking status: Never Smoker  . Smokeless tobacco: Never Used  Substance and Sexual Activity  . Alcohol use: Yes    Comment: rare  . Drug use: No  . Sexual activity: Yes  Other Topics Concern  . Not on file  Social History Narrative  . Not on file   Family History: Family History  Problem Relation Age of Onset  . Cancer Mother        breast, skin  . Cancer Father        Pancreatic   . Hypertension Father    Allergies: No Known Allergies Medications: See med rec.  Review of Systems: No fevers, chills, night sweats, weight loss, chest pain, or shortness of breath.   Objective:    General: Well Developed, well nourished, and in no acute distress.  Neuro: Alert and oriented x3, extra-ocular muscles intact, sensation grossly intact.  HEENT: Normocephalic, atraumatic, pupils equal round reactive to light, neck supple, no masses, no lymphadenopathy, thyroid nonpalpable.  Skin: Warm  and dry, no rashes. Cardiac: Regular rate and rhythm, no murmurs rubs or gallops, no lower extremity edema.  Respiratory: Clear to auscultation bilaterally. Not using accessory muscles, speaking in full sentences.  Impression and Recommendations:    Abnormal weight gain Phentermine, topamax, acarbose, she can try them all out. Return in 1 month for a weight check. ___________________________________________ Gwen Her. Dianah Field, M.D., ABFM., CAQSM. Primary Care and [place] Instructor of [place] [facility]

## 2017-05-03 NOTE — Assessment & Plan Note (Signed)
Phentermine, topamax, acarbose, she can try them all out. Return in 1 month for a weight check.

## 2017-05-13 ENCOUNTER — Ambulatory Visit (INDEPENDENT_AMBULATORY_CARE_PROVIDER_SITE_OTHER): Payer: No Typology Code available for payment source | Admitting: Family Medicine

## 2017-05-13 VITALS — BP 133/57 | HR 94 | Temp 98.6°F | Wt 195.0 lb

## 2017-05-13 DIAGNOSIS — J020 Streptococcal pharyngitis: Secondary | ICD-10-CM

## 2017-05-13 DIAGNOSIS — J029 Acute pharyngitis, unspecified: Secondary | ICD-10-CM | POA: Diagnosis not present

## 2017-05-13 LAB — POCT RAPID STREP A (OFFICE): Rapid Strep A Screen: POSITIVE — AB

## 2017-05-13 MED ORDER — PENICILLIN V POTASSIUM 500 MG PO TABS: 500.0000 mg | ORAL_TABLET | Freq: Two times a day (BID) | ORAL | 0 refills | Status: AC

## 2017-05-13 MED FILL — PENICILLIN VK 500 MG TABLET: 500 | 10 days supply | Qty: 20 | Fill #0

## 2017-05-13 NOTE — Progress Notes (Signed)
   Subjective:    Patient ID: Kimberly Knight, female    DOB: [birth year], 32 y.o.   MRN: [id number]  HPI  Kimberly Knight complains of sore throat and fatigue, chills, swollen cervical glands x 48 hours.  Feels similar to when she had strep throat.   Review of Systems     Objective:   Physical Exam        Assessment & Plan:  Sore throat - Positive Strep A. Will tx with Pen V K.  Call if not better in one week.

## 2017-06-21 ENCOUNTER — Telehealth: Payer: Self-pay | Admitting: Family Medicine

## 2017-06-21 MED ORDER — PENICILLIN V POTASSIUM 500 MG PO TABS: 500.0000 mg | ORAL_TABLET | Freq: Two times a day (BID) | ORAL | 0 refills | Status: DC

## 2017-06-21 NOTE — Telephone Encounter (Signed)
32 year old female called worried that she may have strep throat.  She is currently on vacation in [place] with her family.  She was recently diagnosed with strep throat as well as several family members several weeks ago.  Unfortunately she has developed a fever with tender adenopathy, sore throat with no other upper respiratory type symptoms.  She was able to send me a picture of her throat showing some white tonsillar exudate with some 1+ tonsillar swelling.  Because she is on the focus plan she is unable to go to an urgent care there locally.  We will go ahead and treat presumptively for strep throat and sent prescription to [facility] in [place].  Beatrice Lecher, MD

## 2017-07-24 ENCOUNTER — Encounter: Payer: Self-pay | Admitting: Family Medicine

## 2017-07-24 ENCOUNTER — Ambulatory Visit (INDEPENDENT_AMBULATORY_CARE_PROVIDER_SITE_OTHER): Payer: No Typology Code available for payment source | Admitting: Family Medicine

## 2017-07-24 VITALS — BP 106/69 | HR 77 | Ht 73.0 in | Wt 197.0 lb

## 2017-07-24 DIAGNOSIS — Z6826 Body mass index (BMI) 26.0-26.9, adult: Secondary | ICD-10-CM

## 2017-07-24 DIAGNOSIS — Z0000 Encounter for general adult medical examination without abnormal findings: Secondary | ICD-10-CM

## 2017-07-24 NOTE — Progress Notes (Addendum)
Subjective:     Kimberly Knight is a 32 y.o. female and is here for a comprehensive physical exam. The patient reports no problems.  Doing well overall.  She is recently started running for exercise and her goal is to do a 10K.  She has two 5K scheduled in the fall.  She is also cut back on sweet tea but says she will not stop it completely.  She is not on any type of weight loss medication.  She was taking phentermine a couple of months ago.  She plans on just taking with exercise and diet to better control her weight.  She would like to lose about 20 pounds and get down to about 170.  Is otherwise feeling well.  Stress levels have improved somewhat.  Social History   Socioeconomic History  . Marital status: Married    Spouse name: Not on file  . Number of children: Not on file  . Years of education: Not on file  . Highest education level: Not on file  Occupational History  . Not on file  Social Needs  . Financial resource strain: Not on file  . Food insecurity:    Worry: Not on file    Inability: Not on file  . Transportation needs:    Medical: Not on file    Non-medical: Not on file  Tobacco Use  . Smoking status: Never Smoker  . Smokeless tobacco: Never Used  Substance and Sexual Activity  . Alcohol use: Yes    Comment: rare  . Drug use: No  . Sexual activity: Yes  Lifestyle  . Physical activity:    Days per week: Not on file    Minutes per session: Not on file  . Stress: Not on file  Relationships  . Social connections:    Talks on phone: Not on file    Gets together: Not on file    Attends religious service: Not on file    Active member of club or organization: Not on file    Attends meetings of clubs or organizations: Not on file    Relationship status: Not on file  . Intimate partner violence:    Fear of current or ex partner: Not on file    Emotionally abused: Not on file    Physically abused: Not on file    Forced sexual activity: Not on file  Other Topics  Concern  . Not on file  Social History Narrative  . Not on file   Health Maintenance  Topic Date Due  . INFLUENZA VACCINE  09/26/2017  . PAP SMEAR  08/07/2018  . TETANUS/TDAP  05/19/2024  . HIV Screening  Completed    The following portions of the patient's history were reviewed and updated as appropriate: allergies, current medications, past family history, past medical history, past social history, past surgical history and problem list.  Review of Systems A comprehensive review of systems was negative.   Objective:    BP 106/69   Pulse 77   Ht 6\' 1"  (1.854 m)   Wt 197 lb (89.4 kg)   LMP 07/18/2017 (Exact Date)   SpO2 100%   BMI 25.99 kg/m  General appearance: alert, cooperative and appears stated age Head: Normocephalic, without obvious abnormality, atraumatic Eyes: conj clear, EOMI, PEERLA Ears: normal TM's and external ear canals both ears Nose: Nares normal. Septum midline. Mucosa normal. No drainage or sinus tenderness. Throat: lips, mucosa, and tongue normal; teeth and gums normal Neck: no adenopathy, no  carotid bruit, no JVD, supple, symmetrical, trachea midline and thyroid not enlarged, symmetric, no tenderness/mass/nodules Back: symmetric, no curvature. ROM normal. No CVA tenderness. Lungs: clear to auscultation bilaterally Heart: regular rate and rhythm, S1, S2 normal, no murmur, click, rub or gallop Abdomen: soft, non-tender; bowel sounds normal; no masses,  no organomegaly Extremities: extremities normal, atraumatic, no cyanosis or edema Pulses: 2+ and symmetric Skin: Skin color, texture, turgor normal. No rashes or lesions Lymph nodes: Cervical, supraclavicular, and axillary nodes normal. Neurologic: Alert and oriented X 3, normal strength and tone. Normal symmetric reflexes. Normal coordination and gait    Assessment:    Healthy female exam.      Plan:     See After Visit Summary for Counseling Recommendations   Keep up a regular exercise program  and make sure you are eating a healthy diet Try to eat 4 servings of dairy a day, or if you are lactose intolerant take a calcium with vitamin D daily.  Your vaccines are up to date.   BMI 26 - continue to work on exercise and diet.

## 2017-07-24 NOTE — Patient Instructions (Signed)
Preventive Care 18-39 Years, Female Preventive care refers to lifestyle choices and visits with your health care provider that can promote health and wellness. What does preventive care include?  A yearly physical exam. This is also called an annual well check.  Dental exams once or twice a year.  Routine eye exams. Ask your health care provider how often you should have your eyes checked.  Personal lifestyle choices, including: ? Daily care of your teeth and gums. ? Regular physical activity. ? Eating a healthy diet. ? Avoiding tobacco and drug use. ? Limiting alcohol use. ? Practicing safe sex. ? Taking vitamin and mineral supplements as recommended by your health care provider. What happens during an annual well check? The services and screenings done by your health care provider during your annual well check will depend on your age, overall health, lifestyle risk factors, and family history of disease. Counseling Your health care provider may ask you questions about your:  Alcohol use.  Tobacco use.  Drug use.  Emotional well-being.  Home and relationship well-being.  Sexual activity.  Eating habits.  Work and work Statistician.  Method of birth control.  Menstrual cycle.  Pregnancy history.  Screening You may have the following tests or measurements:  Height, weight, and BMI.  Diabetes screening. This is done by checking your blood sugar (glucose) after you have not eaten for a while (fasting).  Blood pressure.  Lipid and cholesterol levels. These may be checked every 5 years starting at age 42.  Skin check.  Hepatitis C blood test.  Hepatitis B blood test.  Sexually transmitted disease (STD) testing.  BRCA-related cancer screening. This may be done if you have a family history of breast, ovarian, tubal, or peritoneal cancers.  Pelvic exam and Pap test. This may be done every 3 years starting at age 32. Starting at age 45, this may be done  every 5 years if you have a Pap test in combination with an HPV test.  Discuss your test results, treatment options, and if necessary, the need for more tests with your health care provider. Vaccines Your health care provider may recommend certain vaccines, such as:  Influenza vaccine. This is recommended every year.  Tetanus, diphtheria, and acellular pertussis (Tdap, Td) vaccine. You may need a Td booster every 10 years.  Varicella vaccine. You may need this if you have not been vaccinated.  HPV vaccine. If you are 57 or younger, you may need three doses over 6 months.  Measles, mumps, and rubella (MMR) vaccine. You may need at least one dose of MMR. You may also need a second dose.  Pneumococcal 13-valent conjugate (PCV13) vaccine. You may need this if you have certain conditions and were not previously vaccinated.  Pneumococcal polysaccharide (PPSV23) vaccine. You may need one or two doses if you smoke cigarettes or if you have certain conditions.  Meningococcal vaccine. One dose is recommended if you are age 8-21 years and a first-year college student living in a residence hall, or if you have one of several medical conditions. You may also need additional booster doses.  Hepatitis A vaccine. You may need this if you have certain conditions or if you travel or work in places where you may be exposed to hepatitis A.  Hepatitis B vaccine. You may need this if you have certain conditions or if you travel or work in places where you may be exposed to hepatitis B.  Haemophilus influenzae type b (Hib) vaccine. You may need this  if you have certain risk factors.  Talk to your health care provider about which screenings and vaccines you need and how often you need them. This information is not intended to replace advice given to you by your health care provider. Make sure you discuss any questions you have with your health care provider. Document Released: 04/10/2001 Document Revised:  11/02/2015 Document Reviewed: 12/14/2014 Elsevier Interactive Patient Education  2018 Elsevier Inc.  

## 2017-07-26 LAB — COMPLETE METABOLIC PANEL WITH GFR
AG Ratio: 1.8 (calc) (ref 1.0–2.5)
ALT: 14 U/L (ref 6–29)
AST: 13 U/L (ref 10–30)
Albumin: 4.4 g/dL (ref 3.6–5.1)
Alkaline phosphatase (APISO): 50 U/L (ref 33–115)
BILIRUBIN TOTAL: 0.7 mg/dL (ref 0.2–1.2)
BUN: 13 mg/dL (ref 7–25)
CALCIUM: 9.4 mg/dL (ref 8.6–10.2)
CHLORIDE: 106 mmol/L (ref 98–110)
CO2: 25 mmol/L (ref 20–32)
Creat: 0.9 mg/dL (ref 0.50–1.10)
GFR, EST AFRICAN AMERICAN: 97 mL/min/{1.73_m2} (ref >=60)
GFR, EST NON AFRICAN AMERICAN: 84 mL/min/{1.73_m2} (ref >=60)
GLUCOSE: 100 mg/dL — AB (ref 65–99)
Globulin: 2.4 g/dL (calc) (ref 1.9–3.7)
Potassium: 4.4 mmol/L (ref 3.5–5.3)
Sodium: 140 mmol/L (ref 135–146)
TOTAL PROTEIN: 6.8 g/dL (ref 6.1–8.1)

## 2017-07-26 LAB — LIPID PANEL
Cholesterol: 163 mg/dL (ref <200)
HDL: 48 mg/dL — ABNORMAL LOW (ref >50)
LDL CHOLESTEROL (CALC): 89 mg/dL
Non-HDL Cholesterol (Calc): 115 mg/dL (calc) (ref <130)
TRIGLYCERIDES: 165 mg/dL — AB (ref <150)
Total CHOL/HDL Ratio: 3.4 (calc) (ref <5.0)

## 2017-07-26 LAB — TSH: TSH: 1.86 m[IU]/L

## 2017-07-26 LAB — EXTRA LAV TOP TUBE

## 2018-01-01 ENCOUNTER — Encounter: Payer: Self-pay | Admitting: Family Medicine

## 2018-01-01 ENCOUNTER — Ambulatory Visit (INDEPENDENT_AMBULATORY_CARE_PROVIDER_SITE_OTHER): Payer: No Typology Code available for payment source | Admitting: Family Medicine

## 2018-01-01 VITALS — BP 117/72 | HR 93

## 2018-01-01 DIAGNOSIS — N760 Acute vaginitis: Secondary | ICD-10-CM

## 2018-01-01 LAB — WET PREP FOR TRICH, YEAST, CLUE
MICRO NUMBER:: 91336572
Specimen Quality: ADEQUATE

## 2018-01-02 ENCOUNTER — Telehealth: Payer: Self-pay | Admitting: Family Medicine

## 2018-01-02 ENCOUNTER — Ambulatory Visit: Payer: No Typology Code available for payment source | Admitting: Family Medicine

## 2018-01-02 MED ORDER — FLUCONAZOLE 150 MG PO TABS: 150.0000 mg | ORAL_TABLET | Freq: Once | ORAL | 1 refills | Status: AC

## 2018-01-02 NOTE — Telephone Encounter (Signed)
Rx called in. Pt advised.

## 2018-01-02 NOTE — Progress Notes (Signed)
pt reports going commando last night and she is having a lot of vaginal itching, white to clear d/cpt. She is leaving town [place] for long weekend.

## 2018-01-02 NOTE — Telephone Encounter (Signed)
We will send over prescription for Diflucan for persistent vaginal symptoms.  Patient request [facility] at [address], [place] [address].

## 2018-01-31 ENCOUNTER — Telehealth: Payer: Self-pay | Admitting: Family Medicine

## 2018-01-31 ENCOUNTER — Telehealth: Payer: Self-pay | Admitting: Sports Medicine

## 2018-01-31 DIAGNOSIS — M5136 Other intervertebral disc degeneration, lumbar region with discogenic back pain only: Secondary | ICD-10-CM

## 2018-01-31 DIAGNOSIS — M549 Dorsalgia, unspecified: Secondary | ICD-10-CM

## 2018-01-31 MED ORDER — CYCLOBENZAPRINE HCL 10 MG PO TABS: 5.0000 mg | ORAL_TABLET | Freq: Three times a day (TID) | ORAL | 1 refills | Status: DC | PRN

## 2018-01-31 MED ORDER — BACLOFEN 10 MG PO TABS: 10.0000 mg | ORAL_TABLET | Freq: Every evening | ORAL | 12 refills | Status: DC | PRN

## 2018-01-31 MED ORDER — PREDNISONE 50 MG PO TABS: ORAL_TABLET | ORAL | 0 refills | Status: DC

## 2018-01-31 MED ORDER — HYDROCODONE-ACETAMINOPHEN 5-325 MG PO TABS: 1.0000 | ORAL_TABLET | Freq: Three times a day (TID) | ORAL | 0 refills | Status: DC | PRN

## 2018-01-31 NOTE — Telephone Encounter (Signed)
I saw Kimberly Knight today for acute onset back cramping.  She notes pain in her thoracic and low back.  Pain is worse with activity better with rest.  No weakness or numbness bowel bladder dysfunction.  No fevers or chills.  No injury.  Plan for trial of baclofen with backup printed prescription for Flexeril if not better. Physical exam reassuring no weakness no hyper or hyporeflexia.

## 2018-01-31 NOTE — Telephone Encounter (Signed)
Acute discogenic low back pain. Toradol only minimally efficacious today. Axial without radiculopathy. Prednisone, short course of hydrocodone. She has physical therapy starting next week. No x-rays needed just yet, follow-up in 4 weeks, MR if no better.

## 2018-01-31 NOTE — Assessment & Plan Note (Addendum)
Acute discogenic low back pain. Toradol only minimally efficacious today. Axial without radiculopathy. Prednisone, short course of hydrocodone. She has physical therapy starting next week. No x-rays needed just yet, follow-up in 4 weeks, MR if no better.

## 2018-02-01 ENCOUNTER — Other Ambulatory Visit: Payer: Self-pay | Admitting: Sports Medicine

## 2018-02-01 ENCOUNTER — Ambulatory Visit (HOSPITAL_BASED_OUTPATIENT_CLINIC_OR_DEPARTMENT_OTHER)
Admission: RE | Admit: 2018-02-01 | Discharge: 2018-02-01 | Disposition: A | Discharge status: Home / Self Care | Payer: No Typology Code available for payment source | Source: Ambulatory Visit | Attending: Sports Medicine | Admitting: Sports Medicine

## 2018-02-01 ENCOUNTER — Telehealth: Payer: Self-pay | Admitting: Sports Medicine

## 2018-02-01 DIAGNOSIS — M48061 Spinal stenosis, lumbar region without neurogenic claudication: Secondary | ICD-10-CM | POA: Insufficient documentation

## 2018-02-01 DIAGNOSIS — R32 Unspecified urinary incontinence: Secondary | ICD-10-CM | POA: Insufficient documentation

## 2018-02-01 DIAGNOSIS — M5127 Other intervertebral disc displacement, lumbosacral region: Secondary | ICD-10-CM | POA: Insufficient documentation

## 2018-02-01 NOTE — Telephone Encounter (Addendum)
Kimberly Knight returns, we have been treating her for back pain for a few days now, she has had a shot of Toradol, prednisone, hydrocodone, Parafon forte.  Today she has started to develop some difficulty urinating, and then an episode of urinary incontinence, as well as progressive weakness in her legs, and worsening of pain.  No fevers, chills, night sweats, weight loss, no new trauma.  Because of this, and obvious concern for cauda equina syndrome we are going to proceed to stat lumbar spine MRI.  I was able to get her a slot today at [facility].  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  It does certainly come close to touching the bilateral lateral recess S1 nerve roots, we will treat this as previously planned, physical therapy, steroids, NSAIDs, reevaluate in 4 weeks for epidural planning.

## 2018-02-03 ENCOUNTER — Encounter: Payer: Self-pay | Admitting: Physical Therapy

## 2018-02-03 ENCOUNTER — Ambulatory Visit (INDEPENDENT_AMBULATORY_CARE_PROVIDER_SITE_OTHER): Payer: No Typology Code available for payment source | Admitting: Physical Therapy

## 2018-02-03 ENCOUNTER — Other Ambulatory Visit: Payer: Self-pay

## 2018-02-03 DIAGNOSIS — M5442 Lumbago with sciatica, left side: Secondary | ICD-10-CM

## 2018-02-03 DIAGNOSIS — M5441 Lumbago with sciatica, right side: Secondary | ICD-10-CM | POA: Diagnosis not present

## 2018-02-03 DIAGNOSIS — R29898 Other symptoms and signs involving the musculoskeletal system: Secondary | ICD-10-CM | POA: Diagnosis not present

## 2018-02-03 NOTE — Therapy (Signed)
[place], [place], [address] Phone: [phone]   Fax:  [phone]  Physical Therapy Evaluation  Patient Details  Name: Kimberly Knight MRN: [id number] Date of Birth: 1985/05/12 Referring Provider (PT): Gregor Hams, MD   Encounter Date: 02/03/2018  PT End of Session - 02/03/18 1056    Visit Number  1    Number of Visits  12    Date for PT Re-Evaluation  03/17/18    Authorization Type  [place] [employer]    PT Start Time  0935    PT Stop Time  1025    PT Time Calculation (min)  50 min    Activity Tolerance  Patient tolerated treatment well    Behavior During Therapy  [facility] for tasks assessed/performed       History reviewed. No pertinent past medical history.  Past Surgical History:  Procedure Laterality Date  . hemangioma removal 1995      There were no vitals filed for this visit.   Subjective Assessment - 02/03/18 0937    Subjective  Pt is a 32 y/o female who presents to OPPT with acute onset of LBP x 1 week.  Pt reports increased coughing and running prior to injury.  Pt had massage, and about midback felt a "pop" and then developed some hip tension and discomfort.  Pain continued to increase Friday, started prednisone, TENS.  Then pain increased with pt developing difficulty urinating, and STAT MRI revealed HNP L5-S1.      Limitations  Standing;Walking    How long can you walk comfortably?  10 min    Diagnostic tests  MRI: HNP L5-S1    Patient Stated Goals  improve pain    Currently in Pain?  Yes    Pain Score  --   up to 10/10; overall tightness and instability   Pain Location  Back    Pain Orientation  Mid   Lt maybe worse than Rt   Pain Descriptors / Indicators  Tightness    Pain Type  Acute pain    Pain Onset  In the past 7 days    Pain Frequency  Intermittent    Aggravating Factors   forward bending, walking, standing, twisting    Pain Relieving Factors  prednisone, Kerin Perna PT Assessment - 02/03/18 0944      Assessment   Medical Diagnosis  LBP    Referring Provider (PT)  Gregor Hams, MD    Onset Date/Surgical Date  01/30/18    Hand Dominance  Right      Precautions   Precautions  None      Restrictions   Weight Bearing Restrictions  No      Balance Screen   Has the patient fallen in the past 6 months  No    Has the patient had a decrease in activity level because of a fear of falling?   No    Is the patient reluctant to leave their home because of a fear of falling?   No      Home Film/video editor residence      Prior Function   Level of Independence  Independent    Vocation  Full time employment    Vocation Requirements  PA    Leisure  running      Cognition   Overall Cognitive Status  Within Functional Limits for  tasks assessed      Posture/Postural Control   Posture/Postural Control  Postural limitations    Postural Limitations  Decreased lumbar lordosis      ROM / Strength   AROM / PROM / Strength  AROM;Strength      AROM   Overall AROM Comments  limited ability to tolerate testing positions due to pain    AROM Assessment Site  Lumbar    Lumbar Flexion  deferred    Lumbar Extension  limited 50%    Lumbar - Right Side Bend  deferred    Lumbar - Left Side Bend  deferred    Lumbar - Right Rotation  deferred    Lumbar - Left Rotation  deferred      Strength   Overall Strength Comments  limited ability to tolerate testing positions due to pain; attempted knee strength testing but unable to tolerate    Strength Assessment Site  Hip;Ankle    Right/Left Hip  Right;Left    Right Hip Flexion  4/5    Left Hip Flexion  3/5    Right/Left Ankle  Right;Left    Right Ankle Dorsiflexion  5/5    Left Ankle Dorsiflexion  5/5      Special Tests    Special Tests  Lumbar    Lumbar Tests  Slump Test;Straight Leg Raise      Slump test   Findings  Positive    Comment  bil      Straight Leg Raise    Findings  Positive    Comment  bil                Objective measurements completed on examination: See above findings.      [facility] Adult PT Treatment/Exercise - 02/03/18 0944      Exercises   Exercises  Lumbar      Lumbar Exercises: Stretches   Standing Extension Limitations  attempted; unable at this time    Prone on Elbows Stretch  4 reps;60 seconds   continuous   Press Ups Limitations  attempted; unable at this time      Modalities   Modalities  Traction      Traction   Type of Traction  Lumbar    Min (lbs)  40    Max (lbs)  50    Hold Time  60    Rest Time  20    Time  15             PT Education - 02/03/18 1056    Education Details  HEP    Person(s) Educated  Patient    Methods  Explanation;Demonstration;Handout    Comprehension  Verbalized understanding;Returned demonstration;Need further instruction          PT Long Term Goals - 02/03/18 1324      PT LONG TERM GOAL #1   Title  independent with HEP    Status  New    Target Date  03/17/18      PT LONG TERM GOAL #2   Title  demonstrate negative SLR test for improved function    Status  New    Target Date  03/17/18      PT LONG TERM GOAL #3   Title  core and hip strength improved to at least 4/5 for improved function    Status  New    Target Date  03/17/18      PT LONG TERM GOAL #4   Title  report ability to  tolerate full work day with pain < 5/10    Status  New    Target Date  03/17/18      PT LONG TERM GOAL #5   Title  n/a             Plan - 02/03/18 1320    Clinical Impression Statement  Pt is a 32 y/o female who presents to OPPT for acute LBP, with MRI showing herniated nucleus pulposus.  Pt demonstrates decreased ROM, pain with transitional movements and mobility, as well as gait abnormalities.  Initiated extension based exercises today and trialed traction.  Will benefit from PT to address deficits listed.    Clinical Presentation  Evolving    Clinical Decision  Making  Low    Rehab Potential  Good    PT Frequency  2x / week    PT Duration  6 weeks    PT Treatment/Interventions  ADLs/Self Care Home Management;Cryotherapy;Electrical Stimulation;Ultrasound;Traction;Moist Heat;Gait training;Stair training;Functional mobility training;Therapeutic activities;Therapeutic exercise;Patient/family education;Neuromuscular re-education;Manual techniques;Taping;Dry needling    PT Next Visit Plan  assess response to traction and extension exercises; continue with extension based program and core strengthening    Consulted and Agree with Plan of Care  Patient       Patient will benefit from skilled therapeutic intervention in order to improve the following deficits and impairments:  Abnormal gait, Pain, Increased muscle spasms, Postural dysfunction, Decreased strength, Decreased range of motion, Difficulty walking, Decreased mobility  Visit Diagnosis: Acute bilateral low back pain with bilateral sciatica - Plan: PT plan of care cert/re-cert  Other symptoms and signs involving the musculoskeletal system - Plan: PT plan of care cert/re-cert     Problem List Patient Active Problem List   Diagnosis Date Noted  . Abnormal weight gain 05/03/2017  . Family history of breast cancer 11/21/2016  . Family history of melanoma 11/21/2016  . Discogenic low back pain 01/15/2014      Laureen Abrahams, PT, DPT 02/03/18 1:28 PM    [facility] [place], [place], [address] Phone: [phone]   Fax:  [phone]  Name: Kimberly Knight MRN: [id number] Date of Birth: 01-20-1986

## 2018-02-03 NOTE — Patient Instructions (Signed)
Access Code: [email or web address]  URL: [email or web address]  Date: 02/03/2018  Prepared by: Faustino Congress   Exercises  Prone on Elbows Stretch - 1 reps - 1 sets - 2-5 min hold - 2x daily - 7x weekly  Prone Press Up - 10 reps - 1 sets - 1-2 sec hold - 1x daily - 7x weekly  Standing Lumbar Extension - 10 reps - 1 sets - 1x daily - 7x weekly

## 2018-02-03 NOTE — Telephone Encounter (Signed)
MRI has been approved.

## 2018-02-05 ENCOUNTER — Telehealth: Payer: Self-pay | Admitting: Sports Medicine

## 2018-02-05 MED ORDER — BENZONATATE 200 MG PO CAPS: 200.0000 mg | ORAL_CAPSULE | Freq: Three times a day (TID) | ORAL | 0 refills | Status: DC | PRN

## 2018-02-05 MED ORDER — FLUTICASONE PROPIONATE 50 MCG/ACT NA SUSP: NASAL | 3 refills | Status: DC

## 2018-02-05 MED ORDER — AZITHROMYCIN 250 MG PO TABS: ORAL_TABLET | ORAL | 0 refills | Status: DC

## 2018-02-05 NOTE — Telephone Encounter (Signed)
Increasing facial pain and pressure, nasal discharge, greater than 1 week, increasing cough which is significantly hurting her back.  Adding azithromycin, Flonase, Tessalon Perles.

## 2018-02-06 ENCOUNTER — Ambulatory Visit (INDEPENDENT_AMBULATORY_CARE_PROVIDER_SITE_OTHER): Payer: No Typology Code available for payment source | Admitting: Physical Therapy

## 2018-02-06 ENCOUNTER — Encounter: Payer: Self-pay | Admitting: Physical Therapy

## 2018-02-06 DIAGNOSIS — M5442 Lumbago with sciatica, left side: Secondary | ICD-10-CM | POA: Diagnosis not present

## 2018-02-06 DIAGNOSIS — M5441 Lumbago with sciatica, right side: Secondary | ICD-10-CM

## 2018-02-06 DIAGNOSIS — R29898 Other symptoms and signs involving the musculoskeletal system: Secondary | ICD-10-CM

## 2018-02-06 NOTE — Patient Instructions (Signed)
Access Code: [email or web address]  URL: [email or web address]  Date: 02/06/2018  Prepared by: Faustino Congress   Exercises  Prone on Elbows Stretch - 1 reps - 1 sets - 2-5 min hold - 2x daily - 7x weekly  Prone Press Up - 10 reps - 1 sets - 1-2 sec hold - 1x daily - 7x weekly  Standing Lumbar Extension - 10 reps - 1 sets - 1x daily - 7x weekly  Supine Piriformis Stretch with Foot on Ground - 3 reps - 1 sets - 30 sec hold - 2x daily - 7x weekly  Pelvic Tilt - 10 reps - 1 sets - 5 sec hold - 1x daily - 7x weekly

## 2018-02-06 NOTE — Therapy (Signed)
[place], [place], [address] Phone: [phone]   Fax:  [phone]  Physical Therapy Treatment  Patient Details  Name: Kimberly Knight MRN: [id number] Date of Birth: [birth year] Referring Provider (PT): Gregor Hams, MD   Encounter Date: 02/06/2018  PT End of Session - 02/06/18 1027    Visit Number  2    Number of Visits  12    Date for PT Re-Evaluation  03/17/18    Authorization Type  [place] [employer]    PT Start Time  0935    PT Stop Time  1025    PT Time Calculation (min)  50 min    Activity Tolerance  Patient tolerated treatment well    Behavior During Therapy  [facility] for tasks assessed/performed       History reviewed. No pertinent past medical history.  Past Surgical History:  Procedure Laterality Date  . hemangioma removal 1995      There were no vitals filed for this visit.  Subjective Assessment - 02/06/18 0936    Subjective  doing much better overall, still in pain but able to do more.    Limitations  Standing;Walking    How long can you walk comfortably?  10 min    Diagnostic tests  MRI: HNP L5-S1    Patient Stated Goals  improve pain    Pain Score  --   "feel like I have a rope and two people pulling at either side" but denies pain   Pain Location  Back    Pain Orientation  Mid    Pain Onset  In the past 7 days         [facility] PT Assessment - 02/06/18 1026      Assessment   Medical Diagnosis  LBP    Referring Provider (PT)  Gregor Hams, MD    Onset Date/Surgical Date  01/30/18                   [facility] Adult PT Treatment/Exercise - 02/06/18 0937      Lumbar Exercises: Stretches   Prone on Elbows Stretch  4 reps;60 seconds   continuous   Press Ups  10 reps;5 seconds    Piriformis Stretch  Left;Right;3 reps;30 seconds      Lumbar Exercises: Supine   Ab Set  10 reps;5 seconds      Traction   Type of Traction  Lumbar   prone   Min (lbs)  50    Max (lbs)  60    Hold Time  60    Rest Time  20    Time  15             PT Education - 02/06/18 1027    Education Details  HEP    Person(s) Educated  Patient    Methods  Explanation;Demonstration;Handout    Comprehension  Verbalized understanding;Returned demonstration          PT Long Term Goals - 02/03/18 1324      PT LONG TERM GOAL #1   Title  independent with HEP    Status  New    Target Date  03/17/18      PT LONG TERM GOAL #2   Title  demonstrate negative SLR test for improved function    Status  New    Target Date  03/17/18      PT LONG TERM GOAL #3   Title  core and  hip strength improved to at least 4/5 for improved function    Status  New    Target Date  03/17/18      PT LONG TERM GOAL #4   Title  report ability to tolerate full work day with pain < 5/10    Status  New    Target Date  03/17/18      PT LONG TERM GOAL #5   Title  n/a            Plan - 02/06/18 1027    Clinical Impression Statement  Pt with improvement in symptoms overall and reports greater ease of ADLs and mobility.  Increased time needed with exercises due to pain with transitional movements.  Traction performed in prone today with positive response and easier ability to put on shoes.  Will continue to benefit from PT to maximize function.    Rehab Potential  Good    PT Frequency  2x / week    PT Duration  6 weeks    PT Treatment/Interventions  ADLs/Self Care Home Management;Cryotherapy;Electrical Stimulation;Ultrasound;Traction;Moist Heat;Gait training;Stair training;Functional mobility training;Therapeutic activities;Therapeutic exercise;Patient/family education;Neuromuscular re-education;Manual techniques;Taping;Dry needling    PT Next Visit Plan  assess response to traction and extension exercises; continue with extension based program and core strengthening    PT Home Exercise Plan  Access Code: [email or web address]    Consulted and Agree with Plan of Care  Patient       Patient  will benefit from skilled therapeutic intervention in order to improve the following deficits and impairments:  Abnormal gait, Pain, Increased muscle spasms, Postural dysfunction, Decreased strength, Decreased range of motion, Difficulty walking, Decreased mobility  Visit Diagnosis: Acute bilateral low back pain with bilateral sciatica  Other symptoms and signs involving the musculoskeletal system     Problem List Patient Active Problem List   Diagnosis Date Noted  . Abnormal weight gain 05/03/2017  . Family history of breast cancer 11/21/2016  . Family history of melanoma 11/21/2016  . Discogenic low back pain 01/15/2014      Laureen Abrahams, PT, DPT 02/06/18 10:30 AM     [facility] [place], [place], [address] Phone: [phone]   Fax:  [phone]  Name: Kimberly Knight MRN: [id number] Date of Birth: [birth year]

## 2018-02-10 ENCOUNTER — Telehealth: Payer: Self-pay | Admitting: Sports Medicine

## 2018-02-10 ENCOUNTER — Ambulatory Visit (INDEPENDENT_AMBULATORY_CARE_PROVIDER_SITE_OTHER): Payer: No Typology Code available for payment source | Admitting: Physical Therapy

## 2018-02-10 ENCOUNTER — Encounter: Payer: Self-pay | Admitting: Physical Therapy

## 2018-02-10 DIAGNOSIS — M5442 Lumbago with sciatica, left side: Secondary | ICD-10-CM | POA: Diagnosis not present

## 2018-02-10 DIAGNOSIS — M5441 Lumbago with sciatica, right side: Secondary | ICD-10-CM

## 2018-02-10 DIAGNOSIS — R29898 Other symptoms and signs involving the musculoskeletal system: Secondary | ICD-10-CM | POA: Diagnosis not present

## 2018-02-10 DIAGNOSIS — M5136 Other intervertebral disc degeneration, lumbar region with discogenic back pain only: Secondary | ICD-10-CM

## 2018-02-10 NOTE — Assessment & Plan Note (Signed)
Unfortunately persistent discomfort after over 10 days. MRI shows an acute L5-S1 disc herniation, mostly left sided pain with a positive straight leg raise but no overt radiculitis or radiculopathy. At this point has not responded to prednisone, several shots of Toradol, hydrocodone. Adding a left L5-S1 interlaminar epidural. I hope we can get this done soon, Thursday.

## 2018-02-10 NOTE — Therapy (Signed)
[place], [place], [address] Phone: [phone]   Fax:  [phone]  Physical Therapy Treatment  Patient Details  Name: Kimberly Knight MRN: [id number] Date of Birth: 12-Mar-1985 Referring Provider (PT): Gregor Hams, MD   Encounter Date: 02/10/2018  PT End of Session - 02/10/18 1226    Visit Number  3    Number of Visits  12    Date for PT Re-Evaluation  03/17/18    Authorization Type  [place] [employer]    PT Start Time  2683    PT Stop Time  1231    PT Time Calculation (min)  43 min    Activity Tolerance  Patient limited by pain    Behavior During Therapy  [facility] for tasks assessed/performed       History reviewed. No pertinent past medical history.  Past Surgical History:  Procedure Laterality Date  . hemangioma removal 1995      There were no vitals filed for this visit.  Subjective Assessment - 02/10/18 1226    Subjective  Pt tearful upon arrival.  She states she is doing better than she was 8 days ago, but still very limited. Yesterday was a "good day" with less pain and improved mobility. 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  She is having difficulty with work, because it is painful to sit.      Currently in Pain?  Yes    Pain Score  --   unable to rate pain   Pain Orientation  Lower   centralized   Pain Descriptors / Indicators  Sharp;Tightness    Aggravating Factors   walking, twisting, forward bending    Pain Relieving Factors  ?         [facility] PT Assessment - 02/10/18 0001      Assessment   Medical Diagnosis  LBP       [facility] Adult PT Treatment/Exercise - 02/10/18 0001      Lumbar Exercises: Stretches   Passive Hamstring Stretch  Right;Left;1 rep    Passive Hamstring Stretch Limitations  attempted, supine with strap and PTA assist; unable to tolerate due to increased pain.     Standing Extension  3 reps;10 seconds    Prone on  Elbows Stretch  3 reps;60 seconds    Piriformis Stretch  Left;Right;3 reps;30 seconds      Modalities   Modalities  Electrical Stimulation;Cryotherapy;Traction      Cryotherapy   Number Minutes Cryotherapy  20 Minutes    Cryotherapy Location  --   glutes, during traction    Type of Cryotherapy  Ice pack      Electrical Stimulation   Electrical Stimulation Location  bilat lumbar and glutes     Electrical Stimulation Action  IFC    Electrical Stimulation Parameters  to tolerance     Electrical Stimulation Goals  Pain      Traction   Type of Traction  Lumbar   prone   Min (lbs)  50    Max (lbs)  60    Hold Time  60    Rest Time  20    Time  20      Manual Therapy   Manual Therapy  Myofascial release;Soft tissue mobilization    Soft tissue mobilization  gentle STM to bilat lumbar paraspinals and glutes     Myofascial Release  MFR to Rt lumbar paraspinals  PT Long Term Goals - 02/03/18 1324      PT LONG TERM GOAL #1   Title  independent with HEP    Status  New    Target Date  03/17/18      PT LONG TERM GOAL #2   Title  demonstrate negative SLR test for improved function    Status  New    Target Date  03/17/18      PT LONG TERM GOAL #3   Title  core and hip strength improved to at least 4/5 for improved function    Status  New    Target Date  03/17/18      PT LONG TERM GOAL #4   Title  report ability to tolerate full work day with pain < 5/10    Status  New    Target Date  03/17/18      PT LONG TERM GOAL #5   Title  n/a            Plan - 02/10/18 1249    Clinical Impression Statement  Pt observed with improved lumbar ROM today.  Pt has had positive response to prone traction; increased time and added estim/ice to assist with pain reduction.  Pt reported reduction of symptoms at end of session.  Pt encouraged to continue use of ice and estim as needed to reduce pain.  No new goals met this date.    Rehab Potential  Good    PT Frequency  2x / week     PT Duration  6 weeks    PT Treatment/Interventions  ADLs/Self Care Home Management;Cryotherapy;Electrical Stimulation;Ultrasound;Traction;Moist Heat;Gait training;Stair training;Functional mobility training;Therapeutic activities;Therapeutic exercise;Patient/family education;Neuromuscular re-education;Manual techniques;Taping;Dry needling    PT Next Visit Plan  continue traction (increase pull to 70#) and extension exercises; continue with extension based program and core strengthening    PT Home Exercise Plan  Access Code: [email or web address]    Consulted and Agree with Plan of Care  Patient       Patient will benefit from skilled therapeutic intervention in order to improve the following deficits and impairments:  Abnormal gait, Pain, Increased muscle spasms, Postural dysfunction, Decreased strength, Decreased range of motion, Difficulty walking, Decreased mobility  Visit Diagnosis: Acute bilateral low back pain with bilateral sciatica  Other symptoms and signs involving the musculoskeletal system     Problem List Patient Active Problem List   Diagnosis Date Noted  . Abnormal weight gain 05/03/2017  . Family history of breast cancer 11/21/2016  . Family history of melanoma 11/21/2016  . Discogenic low back pain 01/15/2014   Kerin Perna, PTA 02/10/18 12:55 PM  [place], [place], [address] Phone: [phone]   Fax:  [phone]  Name: ALAIYAH BOLLMAN MRN: [id number] Date of Birth: 11/14/1985

## 2018-02-10 NOTE — Telephone Encounter (Signed)
Ordering epidural

## 2018-02-14 ENCOUNTER — Ambulatory Visit (INDEPENDENT_AMBULATORY_CARE_PROVIDER_SITE_OTHER): Payer: No Typology Code available for payment source | Admitting: Physical Therapy

## 2018-02-14 ENCOUNTER — Encounter: Payer: Self-pay | Admitting: Physical Therapy

## 2018-02-14 DIAGNOSIS — M5442 Lumbago with sciatica, left side: Secondary | ICD-10-CM | POA: Diagnosis not present

## 2018-02-14 DIAGNOSIS — R29898 Other symptoms and signs involving the musculoskeletal system: Secondary | ICD-10-CM | POA: Diagnosis not present

## 2018-02-14 DIAGNOSIS — M5441 Lumbago with sciatica, right side: Secondary | ICD-10-CM

## 2018-02-14 NOTE — Therapy (Signed)
[place], [place], [address] Phone: [phone]   Fax:  [phone]  Physical Therapy Treatment  Patient Details  Name: Kimberly Knight MRN: [id number] Date of Birth: September 14, 1985 Referring Provider (PT): Gregor Hams, MD   Encounter Date: 02/14/2018  PT End of Session - 02/14/18 1240    Visit Number  4    Number of Visits  12    Date for PT Re-Evaluation  03/17/18    Authorization Type  [place] [employer]    PT Start Time  3559    PT Stop Time  1250    PT Time Calculation (min)  55 min    Activity Tolerance  Patient limited by pain    Behavior During Therapy  [facility] for tasks assessed/performed       History reviewed. No pertinent past medical history.  Past Surgical History:  Procedure Laterality Date  . hemangioma removal 1995      There were no vitals filed for this visit.  Subjective Assessment - 02/14/18 1156    Subjective  thought TENS, ice and traction helped.  had 2 "really really good days."  less nerve pain, more tightness.  still with positive SLR test. ESI scheduled for Friday 12/27    Patient Stated Goals  improve pain    Pain Score  0-No pain   avg 3/10   Pain Location  Back    Pain Orientation  Lower    Pain Descriptors / Indicators  Sharp;Tightness    Pain Type  Acute pain    Pain Onset  In the past 7 days    Pain Frequency  Intermittent    Aggravating Factors   walking, twisting, forward bending         [facility] PT Assessment - 02/14/18 1209      Assessment   Medical Diagnosis  LBP    Referring Provider (PT)  Gregor Hams, MD    Onset Date/Surgical Date  01/30/18                   [facility] Adult PT Treatment/Exercise - 02/14/18 1204      Lumbar Exercises: Stretches   Passive Hamstring Stretch  Right;Left;1 rep    Passive Hamstring Stretch Limitations  bent knee    Prone on Elbows Stretch  3 reps;60 seconds    Press Ups  10 reps;5 seconds    Press  Ups Limitations  still difficult due to tightness    Piriformis Stretch  Left;Right;3 reps;30 seconds      Cryotherapy   Number Minutes Cryotherapy  20 Minutes    Cryotherapy Location  Lumbar Spine   glutes   Type of Cryotherapy  Ice pack      Electrical Stimulation   Electrical Stimulation Location  bilat lumbar and glutes     Electrical Stimulation Action  IFC    Electrical Stimulation Parameters  to tolerance x 15 min    Electrical Stimulation Goals  Pain      Traction   Type of Traction  Lumbar    Min (lbs)  60    Max (lbs)  70    Hold Time  60    Rest Time  20    Time  20      Manual Therapy   Manual Therapy  Myofascial release;Soft tissue mobilization    Soft tissue mobilization  gentle STM to bilat lumbar paraspinals and glutes     Myofascial Release  MFR to Rt lumbar paraspinals                   PT Long Term Goals - 02/03/18 1324      PT LONG TERM GOAL #1   Title  independent with HEP    Status  New    Target Date  03/17/18      PT LONG TERM GOAL #2   Title  demonstrate negative SLR test for improved function    Status  New    Target Date  03/17/18      PT LONG TERM GOAL #3   Title  core and hip strength improved to at least 4/5 for improved function    Status  New    Target Date  03/17/18      PT LONG TERM GOAL #4   Title  report ability to tolerate full work day with pain < 5/10    Status  New    Target Date  03/17/18      PT LONG TERM GOAL #5   Title  n/a            Plan - 02/14/18 1241    Clinical Impression Statement  Pt reports improved function overall, 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  Slowly progressing with PT, getting benefit from traction and estim at this time.  Provided massage ball to use PRN.  Pt scheduled for [facility] 12/27 to help with symptoms.    Rehab Potential  Good    PT Frequency  2x / week    PT Duration  6 weeks    PT Treatment/Interventions  ADLs/Self Care Home  Management;Cryotherapy;Electrical Stimulation;Ultrasound;Traction;Moist Heat;Gait training;Stair training;Functional mobility training;Therapeutic activities;Therapeutic exercise;Patient/family education;Neuromuscular re-education;Manual techniques;Taping;Dry needling    PT Next Visit Plan  continue traction (increase pull to 70#) and extension exercises; continue with extension based program and core strengthening    PT Home Exercise Plan  Access Code: [email or web address]    Consulted and Agree with Plan of Care  Patient       Patient will benefit from skilled therapeutic intervention in order to improve the following deficits and impairments:  Abnormal gait, Pain, Increased muscle spasms, Postural dysfunction, Decreased strength, Decreased range of motion, Difficulty walking, Decreased mobility  Visit Diagnosis: Acute bilateral low back pain with bilateral sciatica  Other symptoms and signs involving the musculoskeletal system     Problem List Patient Active Problem List   Diagnosis Date Noted  . Abnormal weight gain 05/03/2017  . Family history of breast cancer 11/21/2016  . Family history of melanoma 11/21/2016  . Discogenic low back pain 01/15/2014      Laureen Abrahams, PT, DPT 02/14/18 12:43 PM     [facility] [place], [place], [address] Phone: [phone]   Fax:  [phone]  Name: MAYRANI KHAMIS MRN: [id number] Date of Birth: May 17, 1985

## 2018-02-18 ENCOUNTER — Ambulatory Visit (INDEPENDENT_AMBULATORY_CARE_PROVIDER_SITE_OTHER): Payer: No Typology Code available for payment source | Admitting: Rehabilitative and Restorative Service Providers"

## 2018-02-18 ENCOUNTER — Encounter: Payer: Self-pay | Admitting: Rehabilitative and Restorative Service Providers"

## 2018-02-18 DIAGNOSIS — M5442 Lumbago with sciatica, left side: Secondary | ICD-10-CM | POA: Diagnosis not present

## 2018-02-18 DIAGNOSIS — R29898 Other symptoms and signs involving the musculoskeletal system: Secondary | ICD-10-CM | POA: Diagnosis not present

## 2018-02-18 DIAGNOSIS — M5441 Lumbago with sciatica, right side: Secondary | ICD-10-CM | POA: Diagnosis not present

## 2018-02-18 NOTE — Therapy (Signed)
[place], [place], [address] Phone: [phone]   Fax:  [phone]  Physical Therapy Treatment  Patient Details  Name: Kimberly Knight MRN: [id number] Date of Birth: [birth year] Referring Provider (PT): Gregor Hams, MD   Encounter Date: 02/18/2018  PT End of Session - 02/18/18 0947    Visit Number  5    Number of Visits  12    Date for PT Re-Evaluation  03/17/18    Authorization Type  [place] [employer]    PT Start Time  0930    PT Stop Time  1022    PT Time Calculation (min)  52 min    Activity Tolerance  Patient tolerated treatment well       History reviewed. No pertinent past medical history.  Past Surgical History:  Procedure Laterality Date  . hemangioma removal 1995      There were no vitals filed for this visit.  Subjective Assessment - 02/18/18 0959    Subjective  Continued improvement in symptoms. Can tell symtpoms are increased with increased activity. Some flare up from increased activity.  Tolerated static traction well.     Currently in Pain?  Yes    Pain Score  3     Pain Location  Back    Pain Orientation  Lower    Pain Descriptors / Indicators  Sharp;Tightness    Pain Type  Acute pain    Pain Onset  1 to 4 weeks ago    Pain Frequency  Intermittent                       [facility] Adult PT Treatment/Exercise - 02/18/18 0001      Self-Care   Self-Care  --   continued education re back care     Lumbar Exercises: Stretches   Prone on Elbows Stretch  3 reps;60 seconds    Press Ups  10 reps;5 seconds      Cryotherapy   Number Minutes Cryotherapy  20 Minutes    Cryotherapy Location  Lumbar Spine   glutes   Type of Cryotherapy  Ice pack      Electrical Stimulation   Electrical Stimulation Location  bilat lumbar and glutes     Electrical Stimulation Action  IFC    Electrical Stimulation Parameters  to tolerance     Electrical Stimulation Goals  Pain       Traction   Type of Traction  Lumbar    Min (lbs)  60    Max (lbs)  70    Hold Time  --   static pull    Time  20             PT Education - 02/18/18 1055    Education Details  education re- back care and activity level     Person(s) Educated  Patient    Methods  Explanation    Comprehension  Verbalized understanding          PT Long Term Goals - 02/03/18 1324      PT LONG TERM GOAL #1   Title  independent with HEP    Status  New    Target Date  03/17/18      PT LONG TERM GOAL #2   Title  demonstrate negative SLR test for improved function    Status  New    Target Date  03/17/18      PT LONG  TERM GOAL #3   Title  core and hip strength improved to at least 4/5 for improved function    Status  New    Target Date  03/17/18      PT LONG TERM GOAL #4   Title  report ability to tolerate full work day with pain < 5/10    Status  New    Target Date  03/17/18      PT LONG TERM GOAL #5   Title  n/a            Plan - 02/18/18 0948    Clinical Impression Statement  Continued improvement in symptoms. Patient has increased activity with decreased pain. Scheduled for [facility] 02/21/18.    Rehab Potential  Good    PT Frequency  2x / week    PT Duration  6 weeks    PT Treatment/Interventions  ADLs/Self Care Home Management;Cryotherapy;Electrical Stimulation;Ultrasound;Traction;Moist Heat;Gait training;Stair training;Functional mobility training;Therapeutic activities;Therapeutic exercise;Patient/family education;Neuromuscular re-education;Manual techniques;Taping;Dry needling    PT Next Visit Plan  continue traction (increase pull to 70#) and extension exercises; continue with extension based program and core strengthening    PT Home Exercise Plan  Access Code: [email or web address]    Consulted and Agree with Plan of Care  Patient       Patient will benefit from skilled therapeutic intervention in order to improve the following deficits and impairments:  Abnormal gait,  Pain, Increased muscle spasms, Postural dysfunction, Decreased strength, Decreased range of motion, Difficulty walking, Decreased mobility  Visit Diagnosis: Acute bilateral low back pain with bilateral sciatica  Other symptoms and signs involving the musculoskeletal system     Problem List Patient Active Problem List   Diagnosis Date Noted  . Abnormal weight gain 05/03/2017  . Family history of breast cancer 11/21/2016  . Family history of melanoma 11/21/2016  . Discogenic low back pain 01/15/2014    Geraldina Parrott Nilda Simmer PT, MPH  02/18/2018, 10:56 AM  [facility] [place], [place], [address] Phone: [phone]   Fax:  [phone]  Name: LEAH THORNBERRY MRN: [id number] Date of Birth: [birth year]

## 2018-02-20 ENCOUNTER — Encounter: Payer: Self-pay | Admitting: Rehabilitative and Restorative Service Providers"

## 2018-02-20 ENCOUNTER — Ambulatory Visit (INDEPENDENT_AMBULATORY_CARE_PROVIDER_SITE_OTHER): Payer: No Typology Code available for payment source | Admitting: Rehabilitative and Restorative Service Providers"

## 2018-02-20 DIAGNOSIS — R29898 Other symptoms and signs involving the musculoskeletal system: Secondary | ICD-10-CM | POA: Diagnosis not present

## 2018-02-20 DIAGNOSIS — M5441 Lumbago with sciatica, right side: Secondary | ICD-10-CM

## 2018-02-20 DIAGNOSIS — M5442 Lumbago with sciatica, left side: Secondary | ICD-10-CM | POA: Diagnosis not present

## 2018-02-20 NOTE — Therapy (Addendum)
[place], [place], [address] Phone: [phone]   Fax:  [phone]  Physical Therapy Treatment  Patient Details  Name: Kimberly Knight MRN: [id number] Date of Birth: [birth year] Referring Provider (PT): Gregor Hams, MD   Encounter Date: 02/20/2018  PT End of Session - 02/20/18 1106    Visit Number  6    Number of Visits  12    Date for PT Re-Evaluation  03/17/18    Authorization Type  [employer] [employer]    PT Start Time  1100    PT Stop Time  4765    PT Time Calculation (min)  57 min    Activity Tolerance  Patient tolerated treatment well       History reviewed. No pertinent past medical history.  Past Surgical History:  Procedure Laterality Date  . hemangioma removal 1995      There were no vitals filed for this visit.  Subjective Assessment - 02/20/18 1113    Subjective  Continued improvement in symptoms. Can tell symtpoms are increased with increased activity. Some flare up from increased activity.  Scheduled for [facility] tomorrow     Currently in Pain?  No/denies    Pain Score  0-No pain         [facility] PT Assessment - 02/20/18 0001      Assessment   Medical Diagnosis  LBP    Referring Provider (PT)  Gregor Hams, MD    Onset Date/Surgical Date  01/30/18    Hand Dominance  Right    Next MD Visit  02/21/18    Prior Therapy  yes here for LBP       AROM   Lumbar Extension  limited 20%      Palpation   Palpation comment  muscular tightness Lt > Rt lumbar paraspinals; QL; into gluts                    [facility] Adult PT Treatment/Exercise - 02/20/18 0001      Therapeutic Activites    Therapeutic Activities  --   myofacial ball release with 4" ball mid-thigh sitting      Lumbar Exercises: Stretches   Prone on Elbows Stretch  3 reps;60 seconds    Press Ups  10 reps;5 seconds      Lumbar Exercises: Prone   Other Prone Lumbar Exercises  knee extension toe resting on  table; extending knee x 5 each LE; alternate UE ext with knee ext x 5 each side       Cryotherapy   Number Minutes Cryotherapy  20 Minutes    Cryotherapy Location  Lumbar Spine   glutes   Type of Cryotherapy  Ice pack      Electrical Stimulation   Electrical Stimulation Location  bilat lumbar and glutes     Electrical Stimulation Action  IFC    Electrical Stimulation Parameters  to tolerance    Electrical Stimulation Goals  Pain      Traction   Type of Traction  Lumbar    Min (lbs)  70    Max (lbs)  80    Hold Time  --   static traction    Time  20      Manual Therapy   Manual therapy comments  patient prone     Soft tissue mobilization  gentle STM to bilat lumbar paraspinals and glutes    small "pop" Lt lower lumbar to SI  area - pt reports dec pain   Myofascial Release  MFR to bilat lumbar paraspinals                   PT Long Term Goals - 02/03/18 1324      PT LONG TERM GOAL #1   Title  independent with HEP    Status  New    Target Date  03/17/18      PT LONG TERM GOAL #2   Title  demonstrate negative SLR test for improved function    Status  New    Target Date  03/17/18      PT LONG TERM GOAL #3   Title  core and hip strength improved to at least 4/5 for improved function    Status  New    Target Date  03/17/18      PT LONG TERM GOAL #4   Title  report ability to tolerate full work day with pain < 5/10    Status  New    Target Date  03/17/18      PT LONG TERM GOAL #5   Title  n/a            Plan - 02/20/18 1107    Clinical Impression Statement  Symptoms continue on an intermittent basis. Patient has good response to traction. Increased to 80 pound pull with no difficulty. She is progressing with activity level. Scheduled for [facility] tomorrow 02/21/18    Rehab Potential  Good    PT Frequency  2x / week    PT Duration  6 weeks    PT Treatment/Interventions  ADLs/Self Care Home Management;Cryotherapy;Electrical  Stimulation;Ultrasound;Traction;Moist Heat;Gait training;Stair training;Functional mobility training;Therapeutic activities;Therapeutic exercise;Patient/family education;Neuromuscular re-education;Manual techniques;Taping;Dry needling    PT Next Visit Plan  continue traction (increase pull to 70#) and extension exercises; continue with extension based program and core strengthening     PT Home Exercise Plan  Access Code: [email or web address]    Consulted and Agree with Plan of Care  Patient       Patient will benefit from skilled therapeutic intervention in order to improve the following deficits and impairments:  Abnormal gait, Pain, Increased muscle spasms, Postural dysfunction, Decreased strength, Decreased range of motion, Difficulty walking, Decreased mobility  Visit Diagnosis: Acute bilateral low back pain with bilateral sciatica  Other symptoms and signs involving the musculoskeletal system     Problem List Patient Active Problem List   Diagnosis Date Noted  . Abnormal weight gain 05/03/2017  . Family history of breast cancer 11/21/2016  . Family history of melanoma 11/21/2016  . Discogenic low back pain 01/15/2014    Ersie Savino Nilda Simmer  PT, MPH  02/20/2018, 1:54 PM  [facility] [place], [place], [address] Phone: [phone]   Fax:  [phone]  Name: CONSTANCE WHITTLE MRN: [id number] Date of Birth: [birth year]  PHYSICAL THERAPY DISCHARGE SUMMARY  Visits from Start of Care: 6  Current functional level related to goals / functional outcomes: See progress note for discharge status    Remaining deficits: Unknown    Education / Equipment: HEP  Plan: Patient agrees to discharge.  Patient goals were partially met. Patient is being discharged due to being pleased with the current functional level.  ?????    Magdaleno Lortie P. Helene Kelp PT, MPH 04/30/18 12:45 PM

## 2018-02-21 ENCOUNTER — Inpatient Hospital Stay: Admission: RE | Admit: 2018-02-21 | Payer: No Typology Code available for payment source | Source: Ambulatory Visit

## 2018-02-21 ENCOUNTER — Encounter: Payer: No Typology Code available for payment source | Admitting: Physical Therapy

## 2018-02-28 ENCOUNTER — Encounter: Payer: No Typology Code available for payment source | Admitting: Physical Therapy

## 2018-03-04 ENCOUNTER — Encounter: Payer: Self-pay | Admitting: Sports Medicine

## 2018-03-28 ENCOUNTER — Other Ambulatory Visit: Payer: Self-pay | Admitting: Family Medicine

## 2018-03-28 ENCOUNTER — Telehealth: Payer: Self-pay | Admitting: Family Medicine

## 2018-03-28 MED ORDER — OSELTAMIVIR PHOSPHATE 75 MG PO CAPS: 75.0000 mg | ORAL_CAPSULE | Freq: Every day | ORAL | 0 refills | Status: DC

## 2018-03-28 MED FILL — OSELTAMIVIR PHOS 75 MG CAP: 75 | 10 days supply | Qty: 10 | Fill #0

## 2018-03-28 NOTE — Telephone Encounter (Signed)
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 Son tested positive for flu A today and wants to be prophylaxed.

## 2018-04-30 ENCOUNTER — Other Ambulatory Visit: Payer: Self-pay | Admitting: *Deleted

## 2018-04-30 DIAGNOSIS — M9901 Segmental and somatic dysfunction of cervical region: Secondary | ICD-10-CM | POA: Diagnosis not present

## 2018-04-30 DIAGNOSIS — M5137 Other intervertebral disc degeneration, lumbosacral region: Secondary | ICD-10-CM | POA: Diagnosis not present

## 2018-04-30 DIAGNOSIS — M791 Myalgia, unspecified site: Secondary | ICD-10-CM | POA: Diagnosis not present

## 2018-04-30 DIAGNOSIS — M9904 Segmental and somatic dysfunction of sacral region: Secondary | ICD-10-CM | POA: Diagnosis not present

## 2018-04-30 DIAGNOSIS — M5136 Other intervertebral disc degeneration, lumbar region: Secondary | ICD-10-CM

## 2018-04-30 DIAGNOSIS — M9903 Segmental and somatic dysfunction of lumbar region: Secondary | ICD-10-CM | POA: Diagnosis not present

## 2018-04-30 DIAGNOSIS — M549 Dorsalgia, unspecified: Secondary | ICD-10-CM

## 2018-04-30 DIAGNOSIS — M9902 Segmental and somatic dysfunction of thoracic region: Secondary | ICD-10-CM | POA: Diagnosis not present

## 2018-05-01 ENCOUNTER — Telehealth: Payer: Self-pay | Admitting: Family Medicine

## 2018-05-01 MED ORDER — OSELTAMIVIR PHOSPHATE 75 MG PO CAPS: 75.0000 mg | ORAL_CAPSULE | Freq: Two times a day (BID) | ORAL | 0 refills | Status: DC

## 2018-05-01 NOTE — Telephone Encounter (Signed)
Called in today.  She has had a intermittently productive cough for the last 2 days but last night ran a temperature of 101 today she feels very achy and has some congestion.  Thinks she may have flu as she has been exposed at work.  He had a little shortness of breath after her shower but no wheezing or worrisome shortness of breath or chest tightness or pain.  We will go ahead and call in a prescription for Tamiflu.  If increased respiratory symptoms then recommend chest x-ray for further work-up. Her children had URI and fever symptoms last week.   Beatrice Lecher, MD

## 2018-05-07 ENCOUNTER — Telehealth: Payer: Self-pay | Admitting: Family Medicine

## 2018-05-07 DIAGNOSIS — M9903 Segmental and somatic dysfunction of lumbar region: Secondary | ICD-10-CM | POA: Diagnosis not present

## 2018-05-07 DIAGNOSIS — M5137 Other intervertebral disc degeneration, lumbosacral region: Secondary | ICD-10-CM | POA: Diagnosis not present

## 2018-05-07 DIAGNOSIS — M9901 Segmental and somatic dysfunction of cervical region: Secondary | ICD-10-CM | POA: Diagnosis not present

## 2018-05-07 DIAGNOSIS — M9902 Segmental and somatic dysfunction of thoracic region: Secondary | ICD-10-CM | POA: Diagnosis not present

## 2018-05-07 DIAGNOSIS — M791 Myalgia, unspecified site: Secondary | ICD-10-CM | POA: Diagnosis not present

## 2018-05-07 DIAGNOSIS — M9904 Segmental and somatic dysfunction of sacral region: Secondary | ICD-10-CM | POA: Diagnosis not present

## 2018-05-07 NOTE — Telephone Encounter (Signed)
Pt needs copy of MRI to take to chiropractior. Printed and given to patient.

## 2018-05-10 DIAGNOSIS — M5137 Other intervertebral disc degeneration, lumbosacral region: Secondary | ICD-10-CM | POA: Diagnosis not present

## 2018-05-10 DIAGNOSIS — M9904 Segmental and somatic dysfunction of sacral region: Secondary | ICD-10-CM | POA: Diagnosis not present

## 2018-05-10 DIAGNOSIS — M9902 Segmental and somatic dysfunction of thoracic region: Secondary | ICD-10-CM | POA: Diagnosis not present

## 2018-05-10 DIAGNOSIS — M9903 Segmental and somatic dysfunction of lumbar region: Secondary | ICD-10-CM | POA: Diagnosis not present

## 2018-05-10 DIAGNOSIS — M791 Myalgia, unspecified site: Secondary | ICD-10-CM | POA: Diagnosis not present

## 2018-05-10 DIAGNOSIS — M9901 Segmental and somatic dysfunction of cervical region: Secondary | ICD-10-CM | POA: Diagnosis not present

## 2018-05-14 DIAGNOSIS — M5137 Other intervertebral disc degeneration, lumbosacral region: Secondary | ICD-10-CM | POA: Diagnosis not present

## 2018-05-14 DIAGNOSIS — M9902 Segmental and somatic dysfunction of thoracic region: Secondary | ICD-10-CM | POA: Diagnosis not present

## 2018-05-14 DIAGNOSIS — M9904 Segmental and somatic dysfunction of sacral region: Secondary | ICD-10-CM | POA: Diagnosis not present

## 2018-05-14 DIAGNOSIS — M791 Myalgia, unspecified site: Secondary | ICD-10-CM | POA: Diagnosis not present

## 2018-05-14 DIAGNOSIS — M9901 Segmental and somatic dysfunction of cervical region: Secondary | ICD-10-CM | POA: Diagnosis not present

## 2018-05-14 DIAGNOSIS — M9903 Segmental and somatic dysfunction of lumbar region: Secondary | ICD-10-CM | POA: Diagnosis not present

## 2018-05-19 DIAGNOSIS — M9904 Segmental and somatic dysfunction of sacral region: Secondary | ICD-10-CM | POA: Diagnosis not present

## 2018-05-19 DIAGNOSIS — M9901 Segmental and somatic dysfunction of cervical region: Secondary | ICD-10-CM | POA: Diagnosis not present

## 2018-05-19 DIAGNOSIS — M5137 Other intervertebral disc degeneration, lumbosacral region: Secondary | ICD-10-CM | POA: Diagnosis not present

## 2018-05-19 DIAGNOSIS — M9903 Segmental and somatic dysfunction of lumbar region: Secondary | ICD-10-CM | POA: Diagnosis not present

## 2018-05-19 DIAGNOSIS — M9902 Segmental and somatic dysfunction of thoracic region: Secondary | ICD-10-CM | POA: Diagnosis not present

## 2018-05-19 DIAGNOSIS — M791 Myalgia, unspecified site: Secondary | ICD-10-CM | POA: Diagnosis not present

## 2018-05-21 DIAGNOSIS — M791 Myalgia, unspecified site: Secondary | ICD-10-CM | POA: Diagnosis not present

## 2018-05-21 DIAGNOSIS — M9902 Segmental and somatic dysfunction of thoracic region: Secondary | ICD-10-CM | POA: Diagnosis not present

## 2018-05-21 DIAGNOSIS — M5137 Other intervertebral disc degeneration, lumbosacral region: Secondary | ICD-10-CM | POA: Diagnosis not present

## 2018-05-21 DIAGNOSIS — M9901 Segmental and somatic dysfunction of cervical region: Secondary | ICD-10-CM | POA: Diagnosis not present

## 2018-05-21 DIAGNOSIS — M9903 Segmental and somatic dysfunction of lumbar region: Secondary | ICD-10-CM | POA: Diagnosis not present

## 2018-05-21 DIAGNOSIS — M9904 Segmental and somatic dysfunction of sacral region: Secondary | ICD-10-CM | POA: Diagnosis not present

## 2018-05-28 DIAGNOSIS — M791 Myalgia, unspecified site: Secondary | ICD-10-CM | POA: Diagnosis not present

## 2018-05-28 DIAGNOSIS — M5137 Other intervertebral disc degeneration, lumbosacral region: Secondary | ICD-10-CM | POA: Diagnosis not present

## 2018-05-28 DIAGNOSIS — M9904 Segmental and somatic dysfunction of sacral region: Secondary | ICD-10-CM | POA: Diagnosis not present

## 2018-05-28 DIAGNOSIS — M9901 Segmental and somatic dysfunction of cervical region: Secondary | ICD-10-CM | POA: Diagnosis not present

## 2018-05-28 DIAGNOSIS — M9903 Segmental and somatic dysfunction of lumbar region: Secondary | ICD-10-CM | POA: Diagnosis not present

## 2018-05-28 DIAGNOSIS — M9902 Segmental and somatic dysfunction of thoracic region: Secondary | ICD-10-CM | POA: Diagnosis not present

## 2018-05-30 DIAGNOSIS — M5137 Other intervertebral disc degeneration, lumbosacral region: Secondary | ICD-10-CM | POA: Diagnosis not present

## 2018-05-30 DIAGNOSIS — M9904 Segmental and somatic dysfunction of sacral region: Secondary | ICD-10-CM | POA: Diagnosis not present

## 2018-05-30 DIAGNOSIS — M791 Myalgia, unspecified site: Secondary | ICD-10-CM | POA: Diagnosis not present

## 2018-05-30 DIAGNOSIS — M9901 Segmental and somatic dysfunction of cervical region: Secondary | ICD-10-CM | POA: Diagnosis not present

## 2018-05-30 DIAGNOSIS — M9903 Segmental and somatic dysfunction of lumbar region: Secondary | ICD-10-CM | POA: Diagnosis not present

## 2018-05-30 DIAGNOSIS — M9902 Segmental and somatic dysfunction of thoracic region: Secondary | ICD-10-CM | POA: Diagnosis not present

## 2018-06-11 DIAGNOSIS — M5137 Other intervertebral disc degeneration, lumbosacral region: Secondary | ICD-10-CM | POA: Diagnosis not present

## 2018-06-11 DIAGNOSIS — M9904 Segmental and somatic dysfunction of sacral region: Secondary | ICD-10-CM | POA: Diagnosis not present

## 2018-06-11 DIAGNOSIS — M9902 Segmental and somatic dysfunction of thoracic region: Secondary | ICD-10-CM | POA: Diagnosis not present

## 2018-06-11 DIAGNOSIS — M9901 Segmental and somatic dysfunction of cervical region: Secondary | ICD-10-CM | POA: Diagnosis not present

## 2018-06-11 DIAGNOSIS — M791 Myalgia, unspecified site: Secondary | ICD-10-CM | POA: Diagnosis not present

## 2018-06-11 DIAGNOSIS — M9903 Segmental and somatic dysfunction of lumbar region: Secondary | ICD-10-CM | POA: Diagnosis not present

## 2018-06-16 ENCOUNTER — Telehealth: Payer: Self-pay | Admitting: Sports Medicine

## 2018-06-16 DIAGNOSIS — E559 Vitamin D deficiency, unspecified: Secondary | ICD-10-CM

## 2018-06-16 DIAGNOSIS — Z20828 Contact with and (suspected) exposure to other viral communicable diseases: Secondary | ICD-10-CM | POA: Diagnosis not present

## 2018-06-16 DIAGNOSIS — R635 Abnormal weight gain: Secondary | ICD-10-CM | POA: Diagnosis not present

## 2018-06-16 DIAGNOSIS — Z20822 Contact with and (suspected) exposure to covid-19: Secondary | ICD-10-CM

## 2018-06-16 NOTE — Telephone Encounter (Signed)
Ordering Labs

## 2018-07-02 DIAGNOSIS — M9904 Segmental and somatic dysfunction of sacral region: Secondary | ICD-10-CM | POA: Diagnosis not present

## 2018-07-02 DIAGNOSIS — M9903 Segmental and somatic dysfunction of lumbar region: Secondary | ICD-10-CM | POA: Diagnosis not present

## 2018-07-02 DIAGNOSIS — M791 Myalgia, unspecified site: Secondary | ICD-10-CM | POA: Diagnosis not present

## 2018-07-02 DIAGNOSIS — M5137 Other intervertebral disc degeneration, lumbosacral region: Secondary | ICD-10-CM | POA: Diagnosis not present

## 2018-07-02 DIAGNOSIS — M9901 Segmental and somatic dysfunction of cervical region: Secondary | ICD-10-CM | POA: Diagnosis not present

## 2018-07-02 DIAGNOSIS — M9902 Segmental and somatic dysfunction of thoracic region: Secondary | ICD-10-CM | POA: Diagnosis not present

## 2018-07-14 ENCOUNTER — Ambulatory Visit (INDEPENDENT_AMBULATORY_CARE_PROVIDER_SITE_OTHER): Payer: 59 | Admitting: Sports Medicine

## 2018-07-14 DIAGNOSIS — L989 Disorder of the skin and subcutaneous tissue, unspecified: Secondary | ICD-10-CM | POA: Diagnosis not present

## 2018-07-14 NOTE — Progress Notes (Signed)
Subjective:    CC: Scalp lesion  HPI: New scalp lesion, painful, patient has been picking at it.  Present for several weeks, moderate, persistent, localized without radiation.  I reviewed the past medical history, family history, social history, surgical history, and allergies today and no changes were needed.  Please see the problem list section below in epic for further details.  Past Medical History: No past medical history on file. Past Surgical History: Past Surgical History:  Procedure Laterality Date  . hemangioma removal 1995     Social History: Social History   Socioeconomic History  . Marital status: Married    Spouse name: Not on file  . Number of children: Not on file  . Years of education: Not on file  . Highest education level: Not on file  Occupational History  . Not on file  Social Needs  . Financial resource strain: Not on file  . Food insecurity:    Worry: Not on file    Inability: Not on file  . Transportation needs:    Medical: Not on file    Non-medical: Not on file  Tobacco Use  . Smoking status: Never Smoker  . Smokeless tobacco: Never Used  Substance and Sexual Activity  . Alcohol use: Yes    Comment: rare  . Drug use: No  . Sexual activity: Yes  Lifestyle  . Physical activity:    Days per week: Not on file    Minutes per session: Not on file  . Stress: Not on file  Relationships  . Social connections:    Talks on phone: Not on file    Gets together: Not on file    Attends religious service: Not on file    Active member of club or organization: Not on file    Attends meetings of clubs or organizations: Not on file    Relationship status: Not on file  Other Topics Concern  . Not on file  Social History Narrative  . Not on file   Family History: Family History  Problem Relation Age of Onset  . Cancer Mother        breast, skin  . Cancer Father        Pancreatic   . Hypertension Father    Allergies: No Known Allergies  Medications: See med rec.  Review of Systems: No fevers, chills, night sweats, weight loss, chest pain, or shortness of breath.   Objective:    General: Well Developed, well nourished, and in no acute distress.  Neuro: Alert and oriented x3, extra-ocular muscles intact, sensation grossly intact.  HEENT: Normocephalic, atraumatic, pupils equal round reactive to light, neck supple, no masses, no lymphadenopathy, thyroid nonpalpable.  Skin: Warm and dry, no rashes.  There is a fleshy papular lesion on the scalp. Cardiac: Regular rate and rhythm, no murmurs rubs or gallops, no lower extremity edema.  Respiratory: Clear to auscultation bilaterally. Not using accessory muscles, speaking in full sentences.  Procedure:  Cryodestruction of scalp lesion Consent obtained and verified. Time-out conducted. Noted no overlying erythema, induration, or other signs of local infection. Completed without difficulty using Cryo-Gun. Advised to call if fevers/chills, erythema, induration, drainage, or persistent bleeding.  Impression and Recommendations:    Scalp lesion Appear to be a skin tag, cryotherapy as above. If this recurs we will treated as a carrion.   ___________________________________________ Gwen Her. Dianah Field, M.D., ABFM., CAQSM. Primary Care and Sports Medicine [facility]  Adjunct Professor of [employer] of  [employer]

## 2018-07-14 NOTE — Assessment & Plan Note (Signed)
Appear to be a skin tag, cryotherapy as above. If this recurs we will treated as a carrion.

## 2018-07-23 DIAGNOSIS — M5137 Other intervertebral disc degeneration, lumbosacral region: Secondary | ICD-10-CM | POA: Diagnosis not present

## 2018-07-23 DIAGNOSIS — M9904 Segmental and somatic dysfunction of sacral region: Secondary | ICD-10-CM | POA: Diagnosis not present

## 2018-07-23 DIAGNOSIS — M791 Myalgia, unspecified site: Secondary | ICD-10-CM | POA: Diagnosis not present

## 2018-07-23 DIAGNOSIS — M9902 Segmental and somatic dysfunction of thoracic region: Secondary | ICD-10-CM | POA: Diagnosis not present

## 2018-07-23 DIAGNOSIS — M9901 Segmental and somatic dysfunction of cervical region: Secondary | ICD-10-CM | POA: Diagnosis not present

## 2018-07-23 DIAGNOSIS — M9903 Segmental and somatic dysfunction of lumbar region: Secondary | ICD-10-CM | POA: Diagnosis not present

## 2018-07-30 DIAGNOSIS — M9901 Segmental and somatic dysfunction of cervical region: Secondary | ICD-10-CM | POA: Diagnosis not present

## 2018-07-30 DIAGNOSIS — M791 Myalgia, unspecified site: Secondary | ICD-10-CM | POA: Diagnosis not present

## 2018-07-30 DIAGNOSIS — M9904 Segmental and somatic dysfunction of sacral region: Secondary | ICD-10-CM | POA: Diagnosis not present

## 2018-07-30 DIAGNOSIS — M9902 Segmental and somatic dysfunction of thoracic region: Secondary | ICD-10-CM | POA: Diagnosis not present

## 2018-07-30 DIAGNOSIS — M5137 Other intervertebral disc degeneration, lumbosacral region: Secondary | ICD-10-CM | POA: Diagnosis not present

## 2018-07-30 DIAGNOSIS — M9903 Segmental and somatic dysfunction of lumbar region: Secondary | ICD-10-CM | POA: Diagnosis not present

## 2018-08-13 DIAGNOSIS — M9903 Segmental and somatic dysfunction of lumbar region: Secondary | ICD-10-CM | POA: Diagnosis not present

## 2018-08-13 DIAGNOSIS — M9901 Segmental and somatic dysfunction of cervical region: Secondary | ICD-10-CM | POA: Diagnosis not present

## 2018-08-13 DIAGNOSIS — M5137 Other intervertebral disc degeneration, lumbosacral region: Secondary | ICD-10-CM | POA: Diagnosis not present

## 2018-08-13 DIAGNOSIS — M9904 Segmental and somatic dysfunction of sacral region: Secondary | ICD-10-CM | POA: Diagnosis not present

## 2018-08-13 DIAGNOSIS — M791 Myalgia, unspecified site: Secondary | ICD-10-CM | POA: Diagnosis not present

## 2018-08-13 DIAGNOSIS — M9902 Segmental and somatic dysfunction of thoracic region: Secondary | ICD-10-CM | POA: Diagnosis not present

## 2018-08-27 DIAGNOSIS — M791 Myalgia, unspecified site: Secondary | ICD-10-CM | POA: Diagnosis not present

## 2018-08-27 DIAGNOSIS — M9902 Segmental and somatic dysfunction of thoracic region: Secondary | ICD-10-CM | POA: Diagnosis not present

## 2018-08-27 DIAGNOSIS — M9901 Segmental and somatic dysfunction of cervical region: Secondary | ICD-10-CM | POA: Diagnosis not present

## 2018-08-27 DIAGNOSIS — M9903 Segmental and somatic dysfunction of lumbar region: Secondary | ICD-10-CM | POA: Diagnosis not present

## 2018-08-27 DIAGNOSIS — M9904 Segmental and somatic dysfunction of sacral region: Secondary | ICD-10-CM | POA: Diagnosis not present

## 2018-08-27 DIAGNOSIS — M5137 Other intervertebral disc degeneration, lumbosacral region: Secondary | ICD-10-CM | POA: Diagnosis not present

## 2018-09-11 LAB — LIPID PANEL W/REFLEX DIRECT LDL
Cholesterol: 181 mg/dL (ref <200)
HDL: 50 mg/dL (ref >=50)
LDL Cholesterol (Calc): 109 mg/dL (calc) — ABNORMAL HIGH
Non-HDL Cholesterol (Calc): 131 mg/dL (calc) — ABNORMAL HIGH (ref <130)
Total CHOL/HDL Ratio: 3.6 (calc) (ref <5.0)
Triglycerides: 109 mg/dL (ref <150)

## 2018-09-11 LAB — CBC
HCT: 38.8 % (ref 35.0–45.0)
Hemoglobin: 13.6 g/dL (ref 11.7–15.5)
MCH: 31.3 pg (ref 27.0–33.0)
MCHC: 35.1 g/dL (ref 32.0–36.0)
MCV: 89.2 fL (ref 80.0–100.0)
MPV: 12.3 fL (ref 7.5–12.5)
Platelets: 250 10*3/uL (ref 140–400)
RBC: 4.35 10*6/uL (ref 3.80–5.10)
RDW: 12.3 % (ref 11.0–15.0)
WBC: 5 10*3/uL (ref 3.8–10.8)

## 2018-09-11 LAB — COMPLETE METABOLIC PANEL WITH GFR
AG Ratio: 1.7 (calc) (ref 1.0–2.5)
ALT: 34 U/L — ABNORMAL HIGH (ref 6–29)
AST: 23 U/L (ref 10–30)
Albumin: 4.5 g/dL (ref 3.6–5.1)
Alkaline phosphatase (APISO): 52 U/L (ref 31–125)
BUN: 17 mg/dL (ref 7–25)
CO2: 28 mmol/L (ref 20–32)
Calcium: 9.4 mg/dL (ref 8.6–10.2)
Chloride: 104 mmol/L (ref 98–110)
Creat: 0.86 mg/dL (ref 0.50–1.10)
GFR, Est African American: 102 mL/min/{1.73_m2} (ref >=60)
GFR, Est Non African American: 88 mL/min/{1.73_m2} (ref >=60)
Globulin: 2.6 g/dL (calc) (ref 1.9–3.7)
Glucose, Bld: 84 mg/dL (ref 65–99)
Potassium: 4.2 mmol/L (ref 3.5–5.3)
Sodium: 139 mmol/L (ref 135–146)
Total Bilirubin: 0.9 mg/dL (ref 0.2–1.2)
Total Protein: 7.1 g/dL (ref 6.1–8.1)

## 2018-09-11 LAB — VITAMIN D 25 HYDROXY (VIT D DEFICIENCY, FRACTURES): Vit D, 25-Hydroxy: 50 ng/mL (ref 30–100)

## 2018-09-11 LAB — HEMOGLOBIN A1C
Hgb A1c MFr Bld: 5 % of total Hgb (ref <5.7)
Mean Plasma Glucose: 97 (calc)
eAG (mmol/L): 5.4 (calc)

## 2018-09-11 LAB — SAR COV2 SEROLOGY (COVID19)AB(IGG),IA: SARS CoV2 AB IGG: NEGATIVE

## 2018-09-11 LAB — TSH: TSH: 2.49 mIU/L

## 2018-09-18 ENCOUNTER — Ambulatory Visit (INDEPENDENT_AMBULATORY_CARE_PROVIDER_SITE_OTHER): Payer: 59 | Admitting: Family Medicine

## 2018-09-18 ENCOUNTER — Encounter: Payer: Self-pay | Admitting: Family Medicine

## 2018-09-18 VITALS — BP 101/55 | HR 72 | Temp 98.3°F

## 2018-09-18 DIAGNOSIS — J0100 Acute maxillary sinusitis, unspecified: Secondary | ICD-10-CM

## 2018-09-18 MED ORDER — AMOXICILLIN-POT CLAVULANATE 875-125 MG PO TABS: 1.0000 | ORAL_TABLET | Freq: Two times a day (BID) | ORAL | 0 refills | Status: DC

## 2018-09-18 NOTE — Progress Notes (Signed)
Acute Office Visit  Subjective:    Patient ID: Kimberly Knight, female    DOB: September 08, 1985, 33 y.o.   MRN: [id number]  No chief complaint on file.   HPI Patient is in today for 5 days of sinus pressure and nasal congestion.  She has had a lot of thick postnasal drip that is hard to move and clear.  Her ears have been popping.  She is had a mild cough but only because of the drainage nothing in her chest.  No shortness of breath.  No loss of taste or smell.  No fevers chills or sweats.  No GI upset such as diarrhea or vomiting.  Has had bilateral maxillary pressure she has been mostly using Tylenol Sinus as well as some Flonase and occasional ibuprofen.  Avril other family members had some mild symptoms over the weekend but they seem to be better.  History reviewed. No pertinent past medical history.  Past Surgical History:  Procedure Laterality Date  . hemangioma removal 1995      Family History  Problem Relation Age of Onset  . Cancer Mother        breast, skin  . Cancer Father        Pancreatic   . Hypertension Father     Social History   Socioeconomic History  . Marital status: Married    Spouse name: Not on file  . Number of children: Not on file  . Years of education: Not on file  . Highest education level: Not on file  Occupational History  . Not on file  Social Needs  . Financial resource strain: Not on file  . Food insecurity    Worry: Not on file    Inability: Not on file  . Transportation needs    Medical: Not on file    Non-medical: Not on file  Tobacco Use  . Smoking status: Never Smoker  . Smokeless tobacco: Never Used  Substance and Sexual Activity  . Alcohol use: Yes    Comment: rare  . Drug use: No  . Sexual activity: Yes  Lifestyle  . Physical activity    Days per week: Not on file    Minutes per session: Not on file  . Stress: Not on file  Relationships  . Social Herbalist on phone: Not on file    Gets together: Not on file     Attends religious service: Not on file    Active member of club or organization: Not on file    Attends meetings of clubs or organizations: Not on file    Relationship status: Not on file  . Intimate partner violence    Fear of current or ex partner: Not on file    Emotionally abused: Not on file    Physically abused: Not on file    Forced sexual activity: Not on file  Other Topics Concern  . Not on file  Social History Narrative  . Not on file    Outpatient Medications Prior to Visit  Medication Sig Dispense Refill  . baclofen (LIORESAL) 10 MG tablet Take 1 tablet (10 mg total) by mouth at bedtime as needed for muscle spasms. 30 each 12  . cyclobenzaprine (FLEXERIL) 10 MG tablet Take 0.5-1 tablets (5-10 mg total) by mouth 3 (three) times daily as needed for muscle spasms. 30 tablet 1  . fluticasone (FLONASE) 50 MCG/ACT nasal spray One spray in each nostril twice a day, use left hand for  right nostril, and right hand for left nostril. 48 g 3  . oseltamivir (TAMIFLU) 75 MG capsule Take 1 capsule (75 mg total) by mouth 2 (two) times daily. X 10 days 10 capsule 0   No facility-administered medications prior to visit.     No Known Allergies  ROS     Objective:    Physical Exam  Constitutional: She is oriented to person, place, and time. She appears well-developed and well-nourished.  HENT:  Head: Normocephalic and atraumatic.  Right Ear: External ear normal.  Left Ear: External ear normal.  Nose: Nose normal.  Mouth/Throat: Oropharynx is clear and moist.  TMs and canals are clear. Right TM is mildly retracted.   Eyes: Pupils are equal, round, and reactive to light. Conjunctivae and EOM are normal.  Neck: Neck supple. No thyromegaly present.  Cardiovascular: Normal rate, regular rhythm and normal heart sounds.  Pulmonary/Chest: Effort normal and breath sounds normal. She has no wheezes.  Lymphadenopathy:    She has no cervical adenopathy.  Neurological: She is alert and  oriented to person, place, and time.  Skin: Skin is warm and dry.  Psychiatric: She has a normal mood and affect.    BP (!) 101/55   Pulse 72   Temp 98.3 F (36.8 C)   SpO2 95%  Wt Readings from Last 3 Encounters:  07/24/17 197 lb (89.4 kg)  05/13/17 195 lb (88.5 kg)  05/03/17 198 lb (89.8 kg)    Health Maintenance Due  Topic Date Due  . PAP SMEAR-Modifier  08/07/2018    There are no preventive care reminders to display for this patient.   Lab Results  Component Value Date   TSH 2.49 06/16/2018   Lab Results  Component Value Date   WBC 5.0 06/16/2018   HGB 13.6 06/16/2018   HCT 38.8 06/16/2018   MCV 89.2 06/16/2018   PLT 250 06/16/2018   Lab Results  Component Value Date   NA 139 06/16/2018   K 4.2 06/16/2018   CO2 28 06/16/2018   GLUCOSE 84 06/16/2018   BUN 17 06/16/2018   CREATININE 0.86 06/16/2018   BILITOT 0.9 06/16/2018   ALKPHOS 50 07/10/2012   AST 23 06/16/2018   ALT 34 (H) 06/16/2018   PROT 7.1 06/16/2018   ALBUMIN 4.8 07/10/2012   CALCIUM 9.4 06/16/2018   Lab Results  Component Value Date   CHOL 181 06/16/2018   Lab Results  Component Value Date   HDL 50 06/16/2018   Lab Results  Component Value Date   LDLCALC 109 (H) 06/16/2018   Lab Results  Component Value Date   TRIG 109 06/16/2018   Lab Results  Component Value Date   CHOLHDL 3.6 06/16/2018   Lab Results  Component Value Date   HGBA1C 5.0 06/16/2018       Assessment & Plan:   Problem List Items Addressed This Visit    None    Visit Diagnoses    Acute non-recurrent maxillary sinusitis    -  Primary   Relevant Medications   amoxicillin-clavulanate (AUGMENTIN) 875-125 MG tablet     If not improving over the weekend okay to fill prescription for the antibiotic but I did encourage her to give it a couple more days.  Continue with over-the-counter medications for symptomatic relief.  Call if any worsening symptoms or develops a fever.  Meds ordered this encounter   Medications  . amoxicillin-clavulanate (AUGMENTIN) 875-125 MG tablet    Sig: Take 1 tablet by mouth 2 (  two) times daily.    Dispense:  14 tablet    Refill:  0     Beatrice Lecher, MD

## 2018-10-03 DIAGNOSIS — M5137 Other intervertebral disc degeneration, lumbosacral region: Secondary | ICD-10-CM | POA: Diagnosis not present

## 2018-10-03 DIAGNOSIS — M9901 Segmental and somatic dysfunction of cervical region: Secondary | ICD-10-CM | POA: Diagnosis not present

## 2018-10-03 DIAGNOSIS — M9903 Segmental and somatic dysfunction of lumbar region: Secondary | ICD-10-CM | POA: Diagnosis not present

## 2018-10-03 DIAGNOSIS — M791 Myalgia, unspecified site: Secondary | ICD-10-CM | POA: Diagnosis not present

## 2018-10-03 DIAGNOSIS — M9904 Segmental and somatic dysfunction of sacral region: Secondary | ICD-10-CM | POA: Diagnosis not present

## 2018-10-03 DIAGNOSIS — M9902 Segmental and somatic dysfunction of thoracic region: Secondary | ICD-10-CM | POA: Diagnosis not present

## 2018-10-22 ENCOUNTER — Encounter: Payer: Self-pay | Admitting: Family Medicine

## 2018-10-22 ENCOUNTER — Ambulatory Visit (INDEPENDENT_AMBULATORY_CARE_PROVIDER_SITE_OTHER): Payer: 59 | Admitting: Family Medicine

## 2018-10-22 VITALS — BP 107/57 | HR 63 | Ht 73.0 in | Wt 186.0 lb

## 2018-10-22 DIAGNOSIS — N760 Acute vaginitis: Secondary | ICD-10-CM

## 2018-10-22 NOTE — Progress Notes (Signed)
Subjective:     Kimberly Knight is a 33 y.o. female and is here for a comprehensive physical exam. The patient reports no problems. She is sleeping well and feels mood is good. She plans on scheduling her pap later this year.  She is exercising regularly.    She has had some vaginal irritation for a few days. Regular periods.  Plans to schedule her pap later this year.  Some change to discharge.    Social History   Socioeconomic History  . Marital status: Married    Spouse name: Michael Boston  . Number of children: 3  . Years of education: [place]  . Highest education level: Professional school degree (e.g., MD, DDS, DVM, JD)  Occupational History  . Not on file  Social Needs  . Financial resource strain: Not on file  . Food insecurity    Worry: Not on file    Inability: Not on file  . Transportation needs    Medical: Not on file    Non-medical: Not on file  Tobacco Use  . Smoking status: Never Smoker  . Smokeless tobacco: Never Used  Substance and Sexual Activity  . Alcohol use: Yes    Comment: rare  . Drug use: No  . Sexual activity: Yes    Partners: Male  Lifestyle  . Physical activity    Days per week: Not on file    Minutes per session: Not on file  . Stress: Not on file  Relationships  . Social Herbalist on phone: Not on file    Gets together: Not on file    Attends religious service: Not on file    Active member of club or organization: Not on file    Attends meetings of clubs or organizations: Not on file    Relationship status: Not on file  . Intimate partner violence    Fear of current or ex partner: Not on file    Emotionally abused: Not on file    Physically abused: Not on file    Forced sexual activity: Not on file  Other Topics Concern  . Not on file  Social History Narrative  . Not on file   Health Maintenance  Topic Date Due  . PAP SMEAR-Modifier  08/07/2018  . INFLUENZA VACCINE  09/27/2018  . TETANUS/TDAP  05/19/2024  . HIV Screening   Completed    The following portions of the patient's history were reviewed and updated as appropriate: allergies, current medications, past family history, past medical history, past social history, past surgical history and problem list.  Review of Systems A comprehensive review of systems was negative.   Objective:    BP (!) 107/57   Pulse 63   Ht 6\' 1"  (1.854 m)   Wt 186 lb (84.4 kg)   LMP 09/30/2018 (Exact Date)   SpO2 96%   Breastfeeding No   BMI 24.54 kg/m  General appearance: alert, cooperative and appears stated age Head: Normocephalic, without obvious abnormality, atraumatic Eyes: conj clear, EOMI, PEERLA Ears: normal TM's and external ear canals both ears Nose: Nares normal. Septum midline. Mucosa normal. No drainage or sinus tenderness. Throat: lips, mucosa, and tongue normal; teeth and gums normal Neck: no adenopathy, no carotid bruit, no JVD, supple, symmetrical, trachea midline and thyroid not enlarged, symmetric, no tenderness/mass/nodules Back: symmetric, no curvature. ROM normal. No CVA tenderness. Lungs: clear to auscultation bilaterally Heart: regular rate and rhythm, S1, S2 normal, no murmur, click, rub or gallop Abdomen: soft,  non-tender; bowel sounds normal; no masses,  no organomegaly Extremities: extremities normal, atraumatic, no cyanosis or edema Pulses: 2+ and symmetric Skin: Skin color, texture, turgor normal. No rashes or lesions Lymph nodes: Cervical adenopathy: nl and Supraclavicular adenopathy: nl Neurologic: Alert and oriented X 3, normal strength and tone. Normal symmetric reflexes. Normal coordination and gait    Assessment:    Healthy female exam.     Plan:     See After Visit Summary for Counseling Recommendations   Keep up a regular exercise program and make sure you are eating a healthy diet Try to eat 4 servings of dairy a day, or if you are lactose intolerant take a calcium with vitamin D daily.  Your vaccines are up to date.   Had labs done in April.   Had mild elevated LDL.    Vaginitis - wet prep performed.

## 2018-10-22 NOTE — Patient Instructions (Signed)
 Preventive Care 21-35 Years Old, Female Preventive care refers to visits with your health care provider and lifestyle choices that can promote health and wellness. This includes:  A yearly physical exam. This may also be called an annual well check.  Regular dental visits and eye exams.  Immunizations.  Screening for certain conditions.  Healthy lifestyle choices, such as eating a healthy diet, getting regular exercise, not using drugs or products that contain nicotine and tobacco, and limiting alcohol use. What can I expect for my preventive care visit? Physical exam Your health care provider will check your:  Height and weight. This may be used to calculate body mass index (BMI), which tells if you are at a healthy weight.  Heart rate and blood pressure.  Skin for abnormal spots. Counseling Your health care provider may ask you questions about your:  Alcohol, tobacco, and drug use.  Emotional well-being.  Home and relationship well-being.  Sexual activity.  Eating habits.  Work and work environment.  Method of birth control.  Menstrual cycle.  Pregnancy history. What immunizations do I need?  Influenza (flu) vaccine  This is recommended every year. Tetanus, diphtheria, and pertussis (Tdap) vaccine  You may need a Td booster every 10 years. Varicella (chickenpox) vaccine  You may need this if you have not been vaccinated. Human papillomavirus (HPV) vaccine  If recommended by your health care provider, you may need three doses over 6 months. Measles, mumps, and rubella (MMR) vaccine  You may need at least one dose of MMR. You may also need a second dose. Meningococcal conjugate (MenACWY) vaccine  One dose is recommended if you are age 19-21 years and a first-year college student living in a residence hall, or if you have one of several medical conditions. You may also need additional booster doses. Pneumococcal conjugate (PCV13) vaccine  You may need  this if you have certain conditions and were not previously vaccinated. Pneumococcal polysaccharide (PPSV23) vaccine  You may need one or two doses if you smoke cigarettes or if you have certain conditions. Hepatitis A vaccine  You may need this if you have certain conditions or if you travel or work in places where you may be exposed to hepatitis A. Hepatitis B vaccine  You may need this if you have certain conditions or if you travel or work in places where you may be exposed to hepatitis B. Haemophilus influenzae type b (Hib) vaccine  You may need this if you have certain conditions. You may receive vaccines as individual doses or as more than one vaccine together in one shot (combination vaccines). Talk with your health care provider about the risks and benefits of combination vaccines. What tests do I need?  Blood tests  Lipid and cholesterol levels. These may be checked every 5 years starting at age 20.  Hepatitis C test.  Hepatitis B test. Screening  Diabetes screening. This is done by checking your blood sugar (glucose) after you have not eaten for a while (fasting).  Sexually transmitted disease (STD) testing.  BRCA-related cancer screening. This may be done if you have a family history of breast, ovarian, tubal, or peritoneal cancers.  Pelvic exam and Pap test. This may be done every 3 years starting at age 21. Starting at age 30, this may be done every 5 years if you have a Pap test in combination with an HPV test. Talk with your health care provider about your test results, treatment options, and if necessary, the need for more   tests. Follow these instructions at home: Eating and drinking   Eat a diet that includes fresh fruits and vegetables, whole grains, lean protein, and low-fat dairy.  Take vitamin and mineral supplements as recommended by your health care provider.  Do not drink alcohol if: ? Your health care provider tells you not to drink. ? You are  pregnant, may be pregnant, or are planning to become pregnant.  If you drink alcohol: ? Limit how much you have to 0-1 drink a day. ? Be aware of how much alcohol is in your drink. In the [place] one drink equals one 12 oz bottle of beer (355 mL), one 5 oz glass of wine (148 mL), or one 1 oz glass of hard liquor (44 mL). Lifestyle  Take daily care of your teeth and gums.  Stay active. Exercise for at least 30 minutes on 5 or more days each week.  Do not use any products that contain nicotine or tobacco, such as cigarettes, e-cigarettes, and chewing tobacco. If you need help quitting, ask your health care provider.  If you are sexually active, practice safe sex. Use a condom or other form of birth control (contraception) in order to prevent pregnancy and STIs (sexually transmitted infections). If you plan to become pregnant, see your health care provider for a preconception visit. What's next?  Visit your health care provider once a year for a well check visit.  Ask your health care provider how often you should have your eyes and teeth checked.  Stay up to date on all vaccines. This information is not intended to replace advice given to you by your health care provider. Make sure you discuss any questions you have with your health care provider. Document Released: 04/10/2001 Document Revised: 10/24/2017 Document Reviewed: 10/24/2017 Elsevier Patient Education  2020 [employer].

## 2018-10-23 DIAGNOSIS — M9901 Segmental and somatic dysfunction of cervical region: Secondary | ICD-10-CM | POA: Diagnosis not present

## 2018-10-23 DIAGNOSIS — M9904 Segmental and somatic dysfunction of sacral region: Secondary | ICD-10-CM | POA: Diagnosis not present

## 2018-10-23 DIAGNOSIS — M9903 Segmental and somatic dysfunction of lumbar region: Secondary | ICD-10-CM | POA: Diagnosis not present

## 2018-10-23 DIAGNOSIS — M791 Myalgia, unspecified site: Secondary | ICD-10-CM | POA: Diagnosis not present

## 2018-10-23 DIAGNOSIS — M5137 Other intervertebral disc degeneration, lumbosacral region: Secondary | ICD-10-CM | POA: Diagnosis not present

## 2018-10-23 DIAGNOSIS — M9902 Segmental and somatic dysfunction of thoracic region: Secondary | ICD-10-CM | POA: Diagnosis not present

## 2018-10-23 LAB — WET PREP FOR TRICH, YEAST, CLUE
MICRO NUMBER:: 814227
Specimen Quality: ADEQUATE

## 2018-11-12 DIAGNOSIS — M9901 Segmental and somatic dysfunction of cervical region: Secondary | ICD-10-CM | POA: Diagnosis not present

## 2018-11-12 DIAGNOSIS — M9902 Segmental and somatic dysfunction of thoracic region: Secondary | ICD-10-CM | POA: Diagnosis not present

## 2018-11-12 DIAGNOSIS — M791 Myalgia, unspecified site: Secondary | ICD-10-CM | POA: Diagnosis not present

## 2018-11-12 DIAGNOSIS — M9904 Segmental and somatic dysfunction of sacral region: Secondary | ICD-10-CM | POA: Diagnosis not present

## 2018-11-12 DIAGNOSIS — M9903 Segmental and somatic dysfunction of lumbar region: Secondary | ICD-10-CM | POA: Diagnosis not present

## 2018-11-12 DIAGNOSIS — M5137 Other intervertebral disc degeneration, lumbosacral region: Secondary | ICD-10-CM | POA: Diagnosis not present

## 2018-12-10 DIAGNOSIS — M5137 Other intervertebral disc degeneration, lumbosacral region: Secondary | ICD-10-CM | POA: Diagnosis not present

## 2018-12-10 DIAGNOSIS — M9903 Segmental and somatic dysfunction of lumbar region: Secondary | ICD-10-CM | POA: Diagnosis not present

## 2018-12-10 DIAGNOSIS — M9904 Segmental and somatic dysfunction of sacral region: Secondary | ICD-10-CM | POA: Diagnosis not present

## 2018-12-10 DIAGNOSIS — M9902 Segmental and somatic dysfunction of thoracic region: Secondary | ICD-10-CM | POA: Diagnosis not present

## 2018-12-10 DIAGNOSIS — M791 Myalgia, unspecified site: Secondary | ICD-10-CM | POA: Diagnosis not present

## 2018-12-10 DIAGNOSIS — M9901 Segmental and somatic dysfunction of cervical region: Secondary | ICD-10-CM | POA: Diagnosis not present

## 2018-12-24 DIAGNOSIS — M9902 Segmental and somatic dysfunction of thoracic region: Secondary | ICD-10-CM | POA: Diagnosis not present

## 2018-12-24 DIAGNOSIS — M9901 Segmental and somatic dysfunction of cervical region: Secondary | ICD-10-CM | POA: Diagnosis not present

## 2018-12-24 DIAGNOSIS — M5137 Other intervertebral disc degeneration, lumbosacral region: Secondary | ICD-10-CM | POA: Diagnosis not present

## 2018-12-24 DIAGNOSIS — M9903 Segmental and somatic dysfunction of lumbar region: Secondary | ICD-10-CM | POA: Diagnosis not present

## 2018-12-24 DIAGNOSIS — M9904 Segmental and somatic dysfunction of sacral region: Secondary | ICD-10-CM | POA: Diagnosis not present

## 2018-12-24 DIAGNOSIS — M791 Myalgia, unspecified site: Secondary | ICD-10-CM | POA: Diagnosis not present

## 2019-01-01 DIAGNOSIS — H5213 Myopia, bilateral: Secondary | ICD-10-CM | POA: Diagnosis not present

## 2019-01-01 DIAGNOSIS — Z135 Encounter for screening for eye and ear disorders: Secondary | ICD-10-CM | POA: Diagnosis not present

## 2019-01-07 DIAGNOSIS — M791 Myalgia, unspecified site: Secondary | ICD-10-CM | POA: Diagnosis not present

## 2019-01-07 DIAGNOSIS — M9902 Segmental and somatic dysfunction of thoracic region: Secondary | ICD-10-CM | POA: Diagnosis not present

## 2019-01-07 DIAGNOSIS — M9903 Segmental and somatic dysfunction of lumbar region: Secondary | ICD-10-CM | POA: Diagnosis not present

## 2019-01-07 DIAGNOSIS — M5137 Other intervertebral disc degeneration, lumbosacral region: Secondary | ICD-10-CM | POA: Diagnosis not present

## 2019-01-07 DIAGNOSIS — M9904 Segmental and somatic dysfunction of sacral region: Secondary | ICD-10-CM | POA: Diagnosis not present

## 2019-01-07 DIAGNOSIS — M9901 Segmental and somatic dysfunction of cervical region: Secondary | ICD-10-CM | POA: Diagnosis not present

## 2019-01-12 ENCOUNTER — Ambulatory Visit (INDEPENDENT_AMBULATORY_CARE_PROVIDER_SITE_OTHER): Payer: 59 | Admitting: Sports Medicine

## 2019-01-12 ENCOUNTER — Encounter: Payer: Self-pay | Admitting: Sports Medicine

## 2019-01-12 DIAGNOSIS — M5136 Other intervertebral disc degeneration, lumbar region with discogenic back pain only: Secondary | ICD-10-CM

## 2019-01-12 DIAGNOSIS — M791 Myalgia, unspecified site: Secondary | ICD-10-CM | POA: Diagnosis not present

## 2019-01-12 DIAGNOSIS — M9903 Segmental and somatic dysfunction of lumbar region: Secondary | ICD-10-CM | POA: Diagnosis not present

## 2019-01-12 DIAGNOSIS — M9904 Segmental and somatic dysfunction of sacral region: Secondary | ICD-10-CM | POA: Diagnosis not present

## 2019-01-12 DIAGNOSIS — M5137 Other intervertebral disc degeneration, lumbosacral region: Secondary | ICD-10-CM | POA: Diagnosis not present

## 2019-01-12 DIAGNOSIS — M9902 Segmental and somatic dysfunction of thoracic region: Secondary | ICD-10-CM | POA: Diagnosis not present

## 2019-01-12 DIAGNOSIS — M9901 Segmental and somatic dysfunction of cervical region: Secondary | ICD-10-CM | POA: Diagnosis not present

## 2019-01-12 MED ORDER — KETOROLAC TROMETHAMINE 60 MG/2ML IM SOLN
60.0000 mg | Freq: Once | INTRAMUSCULAR | Status: AC
  Administered 2019-01-12: 60 mg via INTRAMUSCULAR

## 2019-01-12 MED ORDER — METHOCARBAMOL 500 MG PO TABS: 500.0000 mg | ORAL_TABLET | Freq: Three times a day (TID) | ORAL | 11 refills | Status: DC

## 2019-01-12 NOTE — Progress Notes (Signed)
Subjective:    CC: Acute low back pain  HPI: This is a pleasant 33 year old female, she has history of lumbar degenerative disc disease with a disc herniation at the L5-S1 level several years back, she has responded well to conservative measures including chiropractic manipulation and physical therapy until recently, she gets manipulated, but improvement only lasts for a couple of weeks at the most.  She continues to do her own home therapy, she has lost significant amounts of weight, but continues to have intermittent discomfort.  Pain is axial, worse with sitting, flexion, Valsalva, nothing radicular today but mostly on the left side with radiation into to the tailbone.  No bowel or bladder dysfunction, no saddle numbness, no constitutional symptoms.  I reviewed the past medical history, family history, social history, surgical history, and allergies today and no changes were needed.  Please see the problem list section below in epic for further details.  Past Medical History: No past medical history on file. Past Surgical History: Past Surgical History:  Procedure Laterality Date  . hemangioma removal 1995     Social History: Social History   Socioeconomic History  . Marital status: Married    Spouse name: Michael Boston  . Number of children: 3  . Years of education: PA  . Highest education level: Professional school degree (e.g., MD, DDS, DVM, JD)  Occupational History  . Not on file  Social Needs  . Financial resource strain: Not on file  . Food insecurity    Worry: Not on file    Inability: Not on file  . Transportation needs    Medical: Not on file    Non-medical: Not on file  Tobacco Use  . Smoking status: Never Smoker  . Smokeless tobacco: Never Used  Substance and Sexual Activity  . Alcohol use: Yes    Comment: rare  . Drug use: No  . Sexual activity: Yes    Partners: Male  Lifestyle  . Physical activity    Days per week: Not on file    Minutes per session: Not on  file  . Stress: Not on file  Relationships  . Social Herbalist on phone: Not on file    Gets together: Not on file    Attends religious service: Not on file    Active member of club or organization: Not on file    Attends meetings of clubs or organizations: Not on file    Relationship status: Not on file  Other Topics Concern  . Not on file  Social History Narrative  . Not on file   Family History: Family History  Problem Relation Age of Onset  . Cancer Mother        breast, skin  . Cancer Father        Pancreatic   . Hypertension Father    Allergies: No Known Allergies Medications: See med rec.  Review of Systems: No fevers, chills, night sweats, weight loss, chest pain, or shortness of breath.   Objective:    General: Well Developed, well nourished, and in no acute distress.  Neuro: Alert and oriented x3, extra-ocular muscles intact, sensation grossly intact.  HEENT: Normocephalic, atraumatic, pupils equal round reactive to light, neck supple, no masses, no lymphadenopathy, thyroid nonpalpable.  Skin: Warm and dry, no rashes. Cardiac: Regular rate and rhythm, no murmurs rubs or gallops, no lower extremity edema.  Respiratory: Clear to auscultation bilaterally. Not using accessory muscles, speaking in full sentences. Back Exam:  Inspection: Unremarkable  Motion: Flexion 45 deg, Extension 45 deg, Side Bending to 45 deg bilaterally,  Rotation to 45 deg bilaterally  SLR laying: Negative  XSLR laying: Negative  Palpable tenderness: None. FABER: negative. Sensory change: Gross sensation intact to all lumbar and sacral dermatomes.  Reflexes: 2+ at both patellar tendons, 2+ at achilles tendons, Babinski's downgoing.  Strength at foot  Plantar-flexion: 5/5 Dorsi-flexion: 5/5 Eversion: 5/5 Inversion: 5/5  Leg strength  Quad: 5/5 Hamstring: 5/5 Hip flexor: 5/5 Hip abductors: 5/5  Gait unremarkable.  Impression and Recommendations:    Discogenic low back pain  Recurrent axial discogenic back pain, mostly left-sided without radiculitis or radiculopathy. Initially managed with chiropractic manipulation but duration is not enough. Toradol 60 intramuscular today. Switching to Robaxin, I am going to going pull the trigger for a left L5-S1 interlaminar epidural. I would also like a second opinion from Dr. Lynann Bologna. Adding flexion/extension x-rays today to evaluate for dynamic listhesis.   ___________________________________________ Gwen Her. Dianah Field, M.D., ABFM., CAQSM. Primary Care and Sports Medicine [facility]  Adjunct Professor of [place] of [employer] [facility] [employer] of Medicine

## 2019-01-12 NOTE — Assessment & Plan Note (Signed)
Recurrent axial discogenic back pain, mostly left-sided without radiculitis or radiculopathy. Initially managed with chiropractic manipulation but duration is not enough. Toradol 60 intramuscular today. Switching to Robaxin, I am going to going pull the trigger for a left L5-S1 interlaminar epidural. I would also like a second opinion from Dr. Lynann Bologna. Adding flexion/extension x-rays today to evaluate for dynamic listhesis.

## 2019-01-12 NOTE — Addendum Note (Signed)
Addended by: Beatris Ship L on: 01/12/2019 11:51 AM   Modules accepted: Orders

## 2019-01-13 ENCOUNTER — Other Ambulatory Visit: Payer: Self-pay

## 2019-01-13 ENCOUNTER — Ambulatory Visit (INDEPENDENT_AMBULATORY_CARE_PROVIDER_SITE_OTHER): Payer: 59

## 2019-01-13 DIAGNOSIS — M5136 Other intervertebral disc degeneration, lumbar region: Secondary | ICD-10-CM

## 2019-01-13 DIAGNOSIS — M545 Low back pain: Secondary | ICD-10-CM | POA: Diagnosis not present

## 2019-01-20 DIAGNOSIS — M5136 Other intervertebral disc degeneration, lumbar region: Secondary | ICD-10-CM | POA: Diagnosis not present

## 2019-01-28 DIAGNOSIS — M5137 Other intervertebral disc degeneration, lumbosacral region: Secondary | ICD-10-CM | POA: Diagnosis not present

## 2019-01-28 DIAGNOSIS — M791 Myalgia, unspecified site: Secondary | ICD-10-CM | POA: Diagnosis not present

## 2019-01-28 DIAGNOSIS — M9903 Segmental and somatic dysfunction of lumbar region: Secondary | ICD-10-CM | POA: Diagnosis not present

## 2019-01-28 DIAGNOSIS — M9904 Segmental and somatic dysfunction of sacral region: Secondary | ICD-10-CM | POA: Diagnosis not present

## 2019-01-28 DIAGNOSIS — M9901 Segmental and somatic dysfunction of cervical region: Secondary | ICD-10-CM | POA: Diagnosis not present

## 2019-01-28 DIAGNOSIS — M9902 Segmental and somatic dysfunction of thoracic region: Secondary | ICD-10-CM | POA: Diagnosis not present

## 2019-02-11 ENCOUNTER — Other Ambulatory Visit: Payer: Self-pay | Admitting: Family Medicine

## 2019-02-11 DIAGNOSIS — Z209 Contact with and (suspected) exposure to unspecified communicable disease: Secondary | ICD-10-CM | POA: Diagnosis not present

## 2019-02-12 LAB — NOVEL CORONAVIRUS, NAA: SARS-CoV-2, NAA: DETECTED — AB

## 2019-03-19 ENCOUNTER — Ambulatory Visit
Admission: RE | Admit: 2019-03-19 | Discharge: 2019-03-19 | Disposition: A | Discharge status: Home / Self Care | Payer: No Typology Code available for payment source | Source: Ambulatory Visit | Attending: Sports Medicine | Admitting: Sports Medicine

## 2019-03-19 MED ORDER — METHYLPREDNISOLONE ACETATE 40 MG/ML INJ SUSP (RADIOLOG
120.0000 mg | Freq: Once | INTRAMUSCULAR | Status: AC
  Administered 2019-03-19: 14:00:00 120 mg via EPIDURAL

## 2019-03-19 MED ORDER — IOPAMIDOL (ISOVUE-M 200) INJECTION 41%
1.0000 mL | Freq: Once | INTRAMUSCULAR | Status: AC
  Administered 2019-03-19: 14:00:00 1 mL via EPIDURAL

## 2019-03-19 NOTE — Discharge Instructions (Signed)

## 2019-03-26 ENCOUNTER — Other Ambulatory Visit: Payer: Self-pay | Admitting: Neurology

## 2019-03-26 DIAGNOSIS — M5136 Other intervertebral disc degeneration, lumbar region: Secondary | ICD-10-CM

## 2019-04-24 ENCOUNTER — Ambulatory Visit (INDEPENDENT_AMBULATORY_CARE_PROVIDER_SITE_OTHER): Payer: No Typology Code available for payment source | Admitting: Sports Medicine

## 2019-04-24 DIAGNOSIS — M5136 Other intervertebral disc degeneration, lumbar region with discogenic back pain only: Secondary | ICD-10-CM

## 2019-04-24 NOTE — Progress Notes (Signed)
    Procedures performed today:    None.  Independent interpretation of tests performed by another provider:   I again reviewed her lumbar spine MRI that shows a large disc extrusion at L5-S1.  No evidence of spondylolisthesis on flexion/extension lumbar spine x-rays.  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 She has a large L5-S1 disc extrusion, at the time of the MRI there was no desiccation.  She also has no evidence of listhesis on flexion/extension views. She has had an epidural that provided some relief, she also has seen Dr. Lynann Bologna with spine surgery who is suggesting continued conservative measures.  His surgical plan however was L5-S1 fusion. Unfortunately she is having recurrence of pain, I am going to order a repeat lumbar epidural, I would also like a second opinion from neurosurgery.    ___________________________________________ Kimberly Knight, M.D., ABFM., CAQSM. Primary Care and [place] Instructor of [place] [employer] [facility] of Medicine

## 2019-04-24 NOTE — Assessment & Plan Note (Addendum)
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 She has a large L5-S1 disc extrusion, at the time of the MRI there was no desiccation.  She also has no evidence of listhesis on flexion/extension views. She has had an epidural that provided some relief, she also has seen Dr. Lynann Bologna with spine surgery who is suggesting continued conservative measures.  His surgical plan however was L5-S1 fusion. Unfortunately she is having recurrence of pain, I am going to order a repeat lumbar epidural, I would also like a second opinion from neurosurgery.

## 2019-05-06 ENCOUNTER — Other Ambulatory Visit: Payer: Self-pay | Admitting: Neurological Surgery

## 2019-05-06 DIAGNOSIS — M5126 Other intervertebral disc displacement, lumbar region: Secondary | ICD-10-CM

## 2019-05-11 ENCOUNTER — Other Ambulatory Visit: Payer: No Typology Code available for payment source

## 2019-05-18 ENCOUNTER — Ambulatory Visit (HOSPITAL_COMMUNITY): Admission: RE | Admit: 2019-05-18 | Payer: No Typology Code available for payment source | Source: Ambulatory Visit

## 2019-05-18 ENCOUNTER — Other Ambulatory Visit: Payer: Self-pay

## 2019-05-18 ENCOUNTER — Ambulatory Visit (INDEPENDENT_AMBULATORY_CARE_PROVIDER_SITE_OTHER): Payer: No Typology Code available for payment source

## 2019-05-18 DIAGNOSIS — M5126 Other intervertebral disc displacement, lumbar region: Secondary | ICD-10-CM | POA: Diagnosis not present

## 2019-05-22 ENCOUNTER — Telehealth: Payer: Self-pay | Admitting: Family Medicine

## 2019-05-22 DIAGNOSIS — L989 Disorder of the skin and subcutaneous tissue, unspecified: Secondary | ICD-10-CM

## 2019-05-22 NOTE — Telephone Encounter (Signed)
Has small pink round shiny lesion near tip of nose.  Needs evaluation for possible basal.  Mom with hx of skin can.

## 2019-06-01 ENCOUNTER — Other Ambulatory Visit: Payer: Self-pay | Admitting: Neurological Surgery

## 2019-06-01 ENCOUNTER — Other Ambulatory Visit: Payer: Self-pay | Admitting: Nurse Practitioner

## 2019-06-01 MED ORDER — TRIAMCINOLONE ACETONIDE 0.5 % EX OINT: 1.0000 "application " | TOPICAL_OINTMENT | Freq: Two times a day (BID) | CUTANEOUS | 3 refills | Status: DC

## 2019-06-02 ENCOUNTER — Other Ambulatory Visit: Payer: Self-pay

## 2019-06-15 ENCOUNTER — Telehealth (HOSPITAL_COMMUNITY): Payer: Self-pay

## 2019-06-15 NOTE — Telephone Encounter (Signed)

## 2019-06-16 ENCOUNTER — Ambulatory Visit (INDEPENDENT_AMBULATORY_CARE_PROVIDER_SITE_OTHER): Payer: No Typology Code available for payment source | Admitting: Vascular Surgery

## 2019-06-16 ENCOUNTER — Other Ambulatory Visit: Payer: Self-pay

## 2019-06-16 ENCOUNTER — Encounter: Payer: Self-pay | Admitting: Vascular Surgery

## 2019-06-16 ENCOUNTER — Encounter: Payer: No Typology Code available for payment source | Admitting: Vascular Surgery

## 2019-06-16 VITALS — BP 108/72 | HR 85 | Temp 97.5°F | Resp 20 | Ht 74.0 in | Wt 185.5 lb

## 2019-06-16 DIAGNOSIS — M5137 Other intervertebral disc degeneration, lumbosacral region: Secondary | ICD-10-CM | POA: Diagnosis not present

## 2019-06-16 NOTE — Progress Notes (Signed)
[facility]ist of [place]  Patient name: Kimberly Knight MRN: [id number] DOB: [birth year] Sex: female  REASON FOR CONSULT: Discuss anterior exposure for L5-S1 disc fusion with Dr. Sherley Bounds  HPI: CHRISLYN Knight is a 34 y.o. female, who is here today for discussion of anterior exposure for L 5 S1 fusion.  She has a approximately 4-year history of degenerative disc disease.  She has had extensive nonoperative treatment to include therapeutic massage, physical therapy and chiropractic manipulation.  This did help for several years but has now become progressively severe with both low back and radicular pain.  She is quite active and this is very limiting to her.  Preoperative evaluation has revealed severe disc disease and she has had recommendation for anterior exposure.  Past Medical History:  Diagnosis Date  . Disc degeneration, lumbosacral   . Radicular pain     Family History  Problem Relation Age of Onset  . Cancer Mother        breast, skin  . Cancer Father        Pancreatic   . Hypertension Father     SOCIAL HISTORY: Social History   Socioeconomic History  . Marital status: Married    Spouse name: Michael Boston  . Number of children: 3  . Years of education: PA  . Highest education level: Professional school degree (e.g., MD, DDS, DVM, JD)  Occupational History  . Not on file  Tobacco Use  . Smoking status: Never Smoker  . Smokeless tobacco: Never Used  Substance and Sexual Activity  . Alcohol use: Yes    Comment: rare  . Drug use: No  . Sexual activity: Yes    Partners: Male  Other Topics Concern  . Not on file  Social History Narrative  . Not on file   Social Determinants of Health   Financial Resource Strain:   . Difficulty of Paying Living Expenses:   Food Insecurity:   . Worried About Charity fundraiser in the Last Year:   . Arboriculturist in the Last Year:   Transportation Needs:   . Lexicographer (Medical):   Marland Kitchen Lack of Transportation (Non-Medical):   Physical Activity:   . Days of Exercise per Week:   . Minutes of Exercise per Session:   Stress:   . Feeling of Stress :   Social Connections:   . Frequency of Communication with Friends and Family:   . Frequency of Social Gatherings with Friends and Family:   . Attends Religious Services:   . Active Member of Clubs or Organizations:   . Attends Archivist Meetings:   Marland Kitchen Marital Status:   Intimate Partner Violence:   . Fear of Current or Ex-Partner:   . Emotionally Abused:   Marland Kitchen Physically Abused:   . Sexually Abused:     No Known Allergies  Current Outpatient Medications  Medication Sig Dispense Refill  . fluticasone (FLONASE) 50 MCG/ACT nasal spray One spray in each nostril twice a day, use left hand for right nostril, and right hand for left nostril. (Patient taking differently: Place 1 spray into both nostrils daily as needed (congestion/allergies.). ) 48 g 3  . ibuprofen (ADVIL) 800 MG tablet Take 800 mg by mouth every 8 (eight) hours as needed (for pain.).    Marland Kitchen triamcinolone ointment (KENALOG) 0.5 % Apply 1 application topically 2 (two) times daily. To affected area, avoid eyes and face 30 g 3  . methocarbamol (  ROBAXIN) 500 MG tablet Take 1 tablet (500 mg total) by mouth 3 (three) times daily. (Patient not taking: Reported on 06/09/2019) 90 tablet 11   No current facility-administered medications for this visit.    REVIEW OF SYSTEMS:  [X]  denotes positive finding, [ ]  denotes negative finding Cardiac  Comments:  Chest pain or chest pressure:    Shortness of breath upon exertion:    Short of breath when lying flat:    Irregular heart rhythm:        Vascular    Pain in calf, thigh, or hip brought on by ambulation:    Pain in feet at night that wakes you up from your sleep:     Blood clot in your veins:    Leg swelling:         Pulmonary    Oxygen at home:    Productive cough:       Wheezing:         Neurologic    Sudden weakness in arms or legs:     Sudden numbness in arms or legs:     Sudden onset of difficulty speaking or slurred speech:    Temporary loss of vision in one eye:     Problems with dizziness:         Gastrointestinal    Blood in stool:     Vomited blood:         Genitourinary    Burning when urinating:     Blood in urine:        Psychiatric    Major depression:         Hematologic    Bleeding problems:    Problems with blood clotting too easily:        Skin    Rashes or ulcers:        Constitutional    Fever or chills:      PHYSICAL EXAM: Vitals:   06/16/19 1541  BP: 108/72  Pulse: 85  Resp: 20  Temp: (!) 97.5 F (36.4 C)  SpO2: 100%  Weight: 185 lb 8 oz (84.1 kg)  Height: 6\' 2"  (1.88 m)    GENERAL: The patient is a well-nourished female, in no acute distress. The vital signs are documented above. CARDIOVASCULAR: 2+ radial and 2+ dorsalis pedis pulses bilaterally PULMONARY: There is good air exchange  ABDOMEN: Soft and non-tender  MUSCULOSKELETAL: There are no major deformities or cyanosis. NEUROLOGIC: No focal weakness or paresthesias are detected. SKIN: There are no ulcers or rashes noted. PSYCHIATRIC: The patient has a normal affect.  DATA:  Plain films of her spine from 2020 revealed no evidence of calcification.  MRI from 05/18/2019 was reviewed.  This shows normal location of aortic and vena caval bifurcation  MEDICAL ISSUES: I had long discussion with the patient regarding my role in exposure for L5-S1 disc surgery with Dr. Ronnald Ramp.  I discussed location of the incision and mobilization of the rectus muscle.  Also discussed the retroperitoneal approach with mobilization of the intra-abdominal contents and left ureter and also mobilization of arterial venous structures overlying the spine.  I discussed potential for injury to all of these particularly venous injury.  She has had no prior intra-abdominal surgery.   She is not morbidly obese.  She has no evidence of atherosclerotic change.  I do not see any contraindication for anterior exposure.  We will proceed as planned on 06/22/2019   Rosetta Posner, MD FACS [facility]  Tel [phone] Pager [phone]

## 2019-06-17 NOTE — Pre-Procedure Instructions (Signed)
[facility], [place] - [address] [place] [address] Phone: [phone] Fax: [phone]  [place], [place] [address] Phone: [phone] Fax: [phone]  [id number] [address] [place], [place] [address] Phone: [phone] Fax: [phone]      Your procedure is scheduled on Monday April 26th.  Report to [facility] Main Entrance "A" at 5:30 A.M., and check in at the Admitting office.  Call this number if you have problems the morning of surgery:  [phone]  Call [phone] if you have any questions prior to your surgery date Monday-Friday 8am-4pm    Remember:  Do not eat or drink after midnight the night before your surgery     Take these medicines the morning of surgery with A SIP OF WATER  fluticasone (FLONASE) if needed   As of today, STOP taking any Aspirin (unless otherwise instructed by your surgeon) and Aspirin containing products, Aleve, Naproxen, Ibuprofen, Motrin, Advil, Goody's, BC's, all herbal medications, fish oil, and all vitamins.                      Do not wear jewelry, make up, or nail polish            Do not wear lotions, powders, perfumes/colognes, or deodorant.            Do not shave 48 hours prior to surgery.  Men may shave face and neck.            Do not bring valuables to the hospital.            [facility] is not responsible for any belongings or valuables.  Do NOT Smoke (Tobacco/Vapping) or drink Alcohol 24 hours prior to your procedure If you use a CPAP at night, you may bring all equipment for your overnight stay.   Contacts, glasses, dentures or bridgework may not be worn into surgery.      For patients admitted to the hospital, discharge time will be determined by your treatment team.   Patients  discharged the day of surgery will not be allowed to drive home, and someone needs to stay with them for 24 hours.    Special instructions:   [facility]- Preparing For Surgery  Before surgery, you can play an important role. Because skin is not sterile, your skin needs to be as free of germs as possible. You can reduce the number of germs on your skin by washing with CHG (chlorahexidine gluconate) Soap before surgery.  CHG is an antiseptic cleaner which kills germs and bonds with the skin to continue killing germs even after washing.    Oral Hygiene is also important to reduce your risk of infection.  Remember - BRUSH YOUR TEETH THE MORNING OF SURGERY WITH YOUR REGULAR TOOTHPASTE  Please do not use if you have an allergy to CHG or antibacterial soaps. If your skin becomes reddened/irritated stop using the CHG.  Do not shave (including legs and underarms) for at least 48 hours prior to first CHG shower. It is OK to shave your face.  Please follow these instructions carefully.   1. Shower the NIGHT BEFORE SURGERY and the MORNING OF SURGERY with CHG Soap.   2. If you chose to wash  your hair, wash your hair first as usual with your normal shampoo.  3. After you shampoo, rinse your hair and body thoroughly to remove the shampoo.  4. Use CHG as you would any other liquid soap. You can apply CHG directly to the skin and wash gently with a scrungie or a clean washcloth.   5. Apply the CHG Soap to your body ONLY FROM THE NECK DOWN.  Do not use on open wounds or open sores. Avoid contact with your eyes, ears, mouth and genitals (private parts). Wash Face and genitals (private parts)  with your normal soap.   6. Wash thoroughly, paying special attention to the area where your surgery will be performed.  7. Thoroughly rinse your body with warm water from the neck down.  8. DO NOT shower/wash with your normal soap after using and rinsing off the CHG Soap.  9. Pat yourself dry with a CLEAN  TOWEL.  10. Wear CLEAN PAJAMAS to bed the night before surgery, wear comfortable clothes the morning of surgery  11. Place CLEAN SHEETS on your bed the night of your first shower and DO NOT SLEEP WITH PETS.   Day of Surgery:   Do not apply any deodorants/lotions.  Please wear clean clothes to the hospital/surgery center.   Remember to brush your teeth WITH YOUR REGULAR TOOTHPASTE.   Please read over the following fact sheets that you were given.

## 2019-06-18 ENCOUNTER — Ambulatory Visit (HOSPITAL_COMMUNITY)
Admission: RE | Admit: 2019-06-18 | Discharge: 2019-06-18 | Disposition: A | Discharge status: Home / Self Care | Payer: No Typology Code available for payment source | Source: Ambulatory Visit | Attending: Neurological Surgery | Admitting: Neurological Surgery

## 2019-06-18 ENCOUNTER — Encounter (HOSPITAL_COMMUNITY): Payer: Self-pay

## 2019-06-18 ENCOUNTER — Other Ambulatory Visit (HOSPITAL_COMMUNITY)
Admission: RE | Admit: 2019-06-18 | Discharge: 2019-06-18 | Disposition: A | Discharge status: Home / Self Care | Payer: No Typology Code available for payment source | Source: Ambulatory Visit | Attending: Neurological Surgery | Admitting: Neurological Surgery

## 2019-06-18 ENCOUNTER — Encounter (HOSPITAL_COMMUNITY)
Admission: RE | Admit: 2019-06-18 | Discharge: 2019-06-18 | Disposition: A | Discharge status: Home / Self Care | Payer: No Typology Code available for payment source | Source: Ambulatory Visit | Attending: Neurological Surgery | Admitting: Neurological Surgery

## 2019-06-18 ENCOUNTER — Other Ambulatory Visit: Payer: Self-pay

## 2019-06-18 DIAGNOSIS — M5136 Other intervertebral disc degeneration, lumbar region: Secondary | ICD-10-CM | POA: Insufficient documentation

## 2019-06-18 DIAGNOSIS — Z01818 Encounter for other preprocedural examination: Secondary | ICD-10-CM | POA: Insufficient documentation

## 2019-06-18 DIAGNOSIS — Z20822 Contact with and (suspected) exposure to covid-19: Secondary | ICD-10-CM | POA: Diagnosis not present

## 2019-06-18 DIAGNOSIS — M51369 Other intervertebral disc degeneration, lumbar region without mention of lumbar back pain or lower extremity pain: Secondary | ICD-10-CM

## 2019-06-18 HISTORY — DX: Hypotension, unspecified: I95.9

## 2019-06-18 LAB — ABO/RH: ABO/RH(D): A POS

## 2019-06-18 LAB — BASIC METABOLIC PANEL
Anion gap: 8 (ref 5–15)
BUN: 11 mg/dL (ref 6–20)
CO2: 27 mmol/L (ref 22–32)
Calcium: 9.4 mg/dL (ref 8.9–10.3)
Chloride: 104 mmol/L (ref 98–111)
Creatinine, Ser: 0.84 mg/dL (ref 0.44–1.00)
GFR calc Af Amer: >60 mL/min (ref >60)
GFR calc non Af Amer: >60 mL/min (ref >60)
Glucose, Bld: 90 mg/dL (ref 70–99)
Potassium: 3.7 mmol/L (ref 3.5–5.1)
Sodium: 139 mmol/L (ref 135–145)

## 2019-06-18 LAB — CBC WITH DIFFERENTIAL/PLATELET
Abs Immature Granulocytes: 0 10*3/uL (ref 0.00–0.07)
Basophils Absolute: 0 10*3/uL (ref 0.0–0.1)
Basophils Relative: 1 %
Eosinophils Absolute: 0.1 10*3/uL (ref 0.0–0.5)
Eosinophils Relative: 2 %
HCT: 38.8 % (ref 36.0–46.0)
Hemoglobin: 12.9 g/dL (ref 12.0–15.0)
Immature Granulocytes: 0 %
Lymphocytes Relative: 34 %
Lymphs Abs: 2 10*3/uL (ref 0.7–4.0)
MCH: 30.1 pg (ref 26.0–34.0)
MCHC: 33.2 g/dL (ref 30.0–36.0)
MCV: 90.4 fL (ref 80.0–100.0)
Monocytes Absolute: 0.5 10*3/uL (ref 0.1–1.0)
Monocytes Relative: 8 %
Neutro Abs: 3.2 10*3/uL (ref 1.7–7.7)
Neutrophils Relative %: 55 %
Platelets: 265 10*3/uL (ref 150–400)
RBC: 4.29 MIL/uL (ref 3.87–5.11)
RDW: 12.1 % (ref 11.5–15.5)
WBC: 5.8 10*3/uL (ref 4.0–10.5)
nRBC: 0 % (ref 0.0–0.2)

## 2019-06-18 LAB — PROTIME-INR
INR: 1 (ref 0.8–1.2)
Prothrombin Time: 13.4 seconds (ref 11.4–15.2)

## 2019-06-18 LAB — TYPE AND SCREEN
ABO/RH(D): A POS
Antibody Screen: NEGATIVE

## 2019-06-18 LAB — SURGICAL PCR SCREEN
MRSA, PCR: NEGATIVE
Staphylococcus aureus: POSITIVE — AB

## 2019-06-18 LAB — SARS CORONAVIRUS 2 (TAT 6-24 HRS): SARS Coronavirus 2: NEGATIVE

## 2019-06-18 NOTE — Progress Notes (Signed)
PCP - Minna Merritts @ [facility] Cardiologist - na    Chest x-ray -  06/18/19 EKG - 06/18/19 Stress Test - na ECHO - na Cardiac Cath - na  Sleep Study - na   Blood Thinner Instructions:  na Aspirin Instructions:    COVID TEST-  06/18/19   Anesthesia review:   Patient denies shortness of breath, fever, cough and chest pain at PAT appointment   All instructions explained to the patient, with a verbal understanding of the material. Patient agrees to go over the instructions while at home for a better understanding. Patient also instructed to self quarantine after being tested for COVID-19. The opportunity to ask questions was provided.

## 2019-06-22 ENCOUNTER — Inpatient Hospital Stay (HOSPITAL_COMMUNITY): Payer: No Typology Code available for payment source

## 2019-06-22 ENCOUNTER — Inpatient Hospital Stay (HOSPITAL_COMMUNITY)
Admission: RE | Admit: 2019-06-22 | Discharge: 2019-06-23 | DRG: 460 | Disposition: A | Discharge status: Home / Self Care | Payer: No Typology Code available for payment source | Attending: Neurological Surgery | Admitting: Neurological Surgery

## 2019-06-22 ENCOUNTER — Encounter (HOSPITAL_COMMUNITY): Payer: Self-pay | Admitting: Neurological Surgery

## 2019-06-22 ENCOUNTER — Encounter (HOSPITAL_COMMUNITY)
Admission: RE | Disposition: A | Discharge status: Home / Self Care | Payer: Self-pay | Source: Home / Self Care | Attending: Neurological Surgery

## 2019-06-22 ENCOUNTER — Other Ambulatory Visit: Payer: Self-pay

## 2019-06-22 DIAGNOSIS — M5137 Other intervertebral disc degeneration, lumbosacral region: Secondary | ICD-10-CM | POA: Diagnosis present

## 2019-06-22 DIAGNOSIS — Z981 Arthrodesis status: Secondary | ICD-10-CM

## 2019-06-22 DIAGNOSIS — Z79899 Other long term (current) drug therapy: Secondary | ICD-10-CM | POA: Diagnosis not present

## 2019-06-22 DIAGNOSIS — M4316 Spondylolisthesis, lumbar region: Secondary | ICD-10-CM | POA: Diagnosis present

## 2019-06-22 DIAGNOSIS — M5127 Other intervertebral disc displacement, lumbosacral region: Secondary | ICD-10-CM | POA: Diagnosis present

## 2019-06-22 DIAGNOSIS — Z419 Encounter for procedure for purposes other than remedying health state, unspecified: Secondary | ICD-10-CM

## 2019-06-22 DIAGNOSIS — M5126 Other intervertebral disc displacement, lumbar region: Secondary | ICD-10-CM | POA: Diagnosis present

## 2019-06-22 DIAGNOSIS — Z8249 Family history of ischemic heart disease and other diseases of the circulatory system: Secondary | ICD-10-CM

## 2019-06-22 HISTORY — PX: ANTERIOR LUMBAR FUSION: SHX1170

## 2019-06-22 HISTORY — PX: ABDOMINAL EXPOSURE: SHX5708

## 2019-06-22 LAB — POCT PREGNANCY, URINE: Preg Test, Ur: NEGATIVE

## 2019-06-22 SURGERY — ANTERIOR LUMBAR FUSION 1 LEVEL
Anesthesia: General | Site: Abdomen

## 2019-06-22 MED ORDER — METHOCARBAMOL 500 MG PO TABS
500.0000 mg | ORAL_TABLET | Freq: Four times a day (QID) | ORAL | Status: DC | PRN
  Administered 2019-06-22 – 2019-06-23 (×3): 500 mg via ORAL
  Filled 2019-06-22 (×2): qty 1

## 2019-06-22 MED ORDER — FENTANYL CITRATE (PF) 250 MCG/5ML IJ SOLN
INTRAMUSCULAR | Status: DC | PRN
  Administered 2019-06-22 (×3): 50 ug via INTRAVENOUS
  Administered 2019-06-22: 25 ug via INTRAVENOUS

## 2019-06-22 MED ORDER — ONDANSETRON HCL 4 MG PO TABS: 4.0000 mg | ORAL_TABLET | Freq: Four times a day (QID) | ORAL | Status: DC | PRN

## 2019-06-22 MED ORDER — LACTATED RINGERS IV SOLN: INTRAVENOUS | Status: DC | PRN

## 2019-06-22 MED ORDER — LIDOCAINE 2% (20 MG/ML) 5 ML SYRINGE
INTRAMUSCULAR | Status: AC
  Filled 2019-06-22: qty 5

## 2019-06-22 MED ORDER — ROCURONIUM BROMIDE 10 MG/ML (PF) SYRINGE
PREFILLED_SYRINGE | INTRAVENOUS | Status: DC | PRN
  Administered 2019-06-22: 20 mg via INTRAVENOUS
  Administered 2019-06-22: 10 mg via INTRAVENOUS
  Administered 2019-06-22: 20 mg via INTRAVENOUS
  Administered 2019-06-22: 10 mg via INTRAVENOUS
  Administered 2019-06-22: 80 mg via INTRAVENOUS

## 2019-06-22 MED ORDER — CHLORHEXIDINE GLUCONATE CLOTH 2 % EX PADS: 6.0000 | MEDICATED_PAD | Freq: Once | CUTANEOUS | Status: DC

## 2019-06-22 MED ORDER — MIDAZOLAM HCL 5 MG/5ML IJ SOLN
INTRAMUSCULAR | Status: DC | PRN
  Administered 2019-06-22: 2 mg via INTRAVENOUS

## 2019-06-22 MED ORDER — CEFAZOLIN SODIUM-DEXTROSE 2-4 GM/100ML-% IV SOLN
2.0000 g | Freq: Three times a day (TID) | INTRAVENOUS | Status: AC
  Administered 2019-06-22 (×2): 2 g via INTRAVENOUS
  Filled 2019-06-22 (×2): qty 100

## 2019-06-22 MED ORDER — THROMBIN 20000 UNITS EX SOLR
CUTANEOUS | Status: AC
  Filled 2019-06-22: qty 20000

## 2019-06-22 MED ORDER — DEXAMETHASONE SODIUM PHOSPHATE 10 MG/ML IJ SOLN
INTRAMUSCULAR | Status: AC
  Filled 2019-06-22: qty 1

## 2019-06-22 MED ORDER — ACETAMINOPHEN 500 MG PO TABS
1000.0000 mg | ORAL_TABLET | Freq: Once | ORAL | Status: AC
  Administered 2019-06-22: 1000 mg via ORAL
  Filled 2019-06-22: qty 2

## 2019-06-22 MED ORDER — FENTANYL CITRATE (PF) 250 MCG/5ML IJ SOLN
INTRAMUSCULAR | Status: AC
  Filled 2019-06-22: qty 5

## 2019-06-22 MED ORDER — METHOCARBAMOL 500 MG PO TABS
ORAL_TABLET | ORAL | Status: AC
  Filled 2019-06-22: qty 1

## 2019-06-22 MED ORDER — METHOCARBAMOL 1000 MG/10ML IJ SOLN
500.0000 mg | Freq: Four times a day (QID) | INTRAVENOUS | Status: DC | PRN
  Filled 2019-06-22: qty 5

## 2019-06-22 MED ORDER — MENTHOL 3 MG MT LOZG: 1.0000 | LOZENGE | OROMUCOSAL | Status: DC | PRN

## 2019-06-22 MED ORDER — ONDANSETRON HCL 4 MG/2ML IJ SOLN: 4.0000 mg | Freq: Four times a day (QID) | INTRAMUSCULAR | Status: DC | PRN

## 2019-06-22 MED ORDER — DEXAMETHASONE SODIUM PHOSPHATE 4 MG/ML IJ SOLN
4.0000 mg | Freq: Four times a day (QID) | INTRAMUSCULAR | Status: DC
  Administered 2019-06-22 – 2019-06-23 (×2): 4 mg via INTRAVENOUS
  Filled 2019-06-22 (×2): qty 1

## 2019-06-22 MED ORDER — CELECOXIB 200 MG PO CAPS
200.0000 mg | ORAL_CAPSULE | Freq: Two times a day (BID) | ORAL | Status: DC
  Administered 2019-06-22 – 2019-06-23 (×3): 200 mg via ORAL
  Filled 2019-06-22 (×3): qty 1

## 2019-06-22 MED ORDER — PROPOFOL 10 MG/ML IV BOLUS
INTRAVENOUS | Status: AC
  Filled 2019-06-22: qty 20

## 2019-06-22 MED ORDER — FENTANYL CITRATE (PF) 100 MCG/2ML IJ SOLN
INTRAMUSCULAR | Status: AC
  Filled 2019-06-22: qty 2

## 2019-06-22 MED ORDER — EPHEDRINE 5 MG/ML INJ
INTRAVENOUS | Status: AC
  Filled 2019-06-22: qty 10

## 2019-06-22 MED ORDER — CHLORHEXIDINE GLUCONATE 4 % EX LIQD: 60.0000 mL | Freq: Once | CUTANEOUS | Status: DC

## 2019-06-22 MED ORDER — ONDANSETRON HCL 4 MG/2ML IJ SOLN
INTRAMUSCULAR | Status: AC
  Filled 2019-06-22: qty 2

## 2019-06-22 MED ORDER — 0.9 % SODIUM CHLORIDE (POUR BTL) OPTIME
TOPICAL | Status: DC | PRN
  Administered 2019-06-22: 1000 mL

## 2019-06-22 MED ORDER — PHENYLEPHRINE 40 MCG/ML (10ML) SYRINGE FOR IV PUSH (FOR BLOOD PRESSURE SUPPORT)
PREFILLED_SYRINGE | INTRAVENOUS | Status: DC | PRN
  Administered 2019-06-22 (×2): 40 ug via INTRAVENOUS

## 2019-06-22 MED ORDER — DEXAMETHASONE 4 MG PO TABS
4.0000 mg | ORAL_TABLET | Freq: Four times a day (QID) | ORAL | Status: DC
  Administered 2019-06-22 (×2): 4 mg via ORAL
  Filled 2019-06-22 (×2): qty 1

## 2019-06-22 MED ORDER — ACETAMINOPHEN 650 MG RE SUPP: 650.0000 mg | RECTAL | Status: DC | PRN

## 2019-06-22 MED ORDER — LIDOCAINE 2% (20 MG/ML) 5 ML SYRINGE
INTRAMUSCULAR | Status: DC | PRN
  Administered 2019-06-22: 60 mg via INTRAVENOUS
  Administered 2019-06-22: 40 mg via INTRAVENOUS

## 2019-06-22 MED ORDER — ACETAMINOPHEN 325 MG PO TABS: 650.0000 mg | ORAL_TABLET | ORAL | Status: DC | PRN

## 2019-06-22 MED ORDER — SUCCINYLCHOLINE CHLORIDE 200 MG/10ML IV SOSY
PREFILLED_SYRINGE | INTRAVENOUS | Status: AC
  Filled 2019-06-22: qty 10

## 2019-06-22 MED ORDER — THROMBIN 5000 UNITS EX SOLR: OROMUCOSAL | Status: DC | PRN

## 2019-06-22 MED ORDER — SODIUM CHLORIDE 0.9% FLUSH: 3.0000 mL | INTRAVENOUS | Status: DC | PRN

## 2019-06-22 MED ORDER — THROMBIN 20000 UNITS EX SOLR: CUTANEOUS | Status: DC | PRN

## 2019-06-22 MED ORDER — PROPOFOL 10 MG/ML IV BOLUS
INTRAVENOUS | Status: DC | PRN
  Administered 2019-06-22: 150 mg via INTRAVENOUS

## 2019-06-22 MED ORDER — CEFAZOLIN SODIUM-DEXTROSE 2-4 GM/100ML-% IV SOLN
2.0000 g | INTRAVENOUS | Status: AC
  Administered 2019-06-22: 2 g via INTRAVENOUS
  Filled 2019-06-22: qty 100

## 2019-06-22 MED ORDER — ONDANSETRON HCL 4 MG/2ML IJ SOLN
INTRAMUSCULAR | Status: DC | PRN
  Administered 2019-06-22: 4 mg via INTRAVENOUS

## 2019-06-22 MED ORDER — FENTANYL CITRATE (PF) 100 MCG/2ML IJ SOLN
25.0000 ug | INTRAMUSCULAR | Status: DC | PRN
  Administered 2019-06-22 (×3): 50 ug via INTRAVENOUS

## 2019-06-22 MED ORDER — SUGAMMADEX SODIUM 200 MG/2ML IV SOLN
INTRAVENOUS | Status: DC | PRN
  Administered 2019-06-22: 200 mg via INTRAVENOUS

## 2019-06-22 MED ORDER — MIDAZOLAM HCL 2 MG/2ML IJ SOLN
INTRAMUSCULAR | Status: AC
  Filled 2019-06-22: qty 2

## 2019-06-22 MED ORDER — POTASSIUM CHLORIDE IN NACL 20-0.9 MEQ/L-% IV SOLN: INTRAVENOUS | Status: DC

## 2019-06-22 MED ORDER — SODIUM CHLORIDE 0.9 % IV SOLN: INTRAVENOUS | Status: DC | PRN

## 2019-06-22 MED ORDER — HYDROMORPHONE HCL 1 MG/ML IJ SOLN: 0.5000 mg | INTRAMUSCULAR | Status: DC | PRN

## 2019-06-22 MED ORDER — DEXAMETHASONE SODIUM PHOSPHATE 10 MG/ML IJ SOLN
10.0000 mg | Freq: Once | INTRAMUSCULAR | Status: AC
  Administered 2019-06-22: 10 mg via INTRAVENOUS
  Filled 2019-06-22: qty 1

## 2019-06-22 MED ORDER — ROCURONIUM BROMIDE 10 MG/ML (PF) SYRINGE
PREFILLED_SYRINGE | INTRAVENOUS | Status: AC
  Filled 2019-06-22: qty 10

## 2019-06-22 MED ORDER — OXYCODONE HCL 5 MG PO TABS
10.0000 mg | ORAL_TABLET | ORAL | Status: DC | PRN
  Administered 2019-06-22 – 2019-06-23 (×4): 10 mg via ORAL
  Filled 2019-06-22 (×4): qty 2

## 2019-06-22 MED ORDER — PHENYLEPHRINE 40 MCG/ML (10ML) SYRINGE FOR IV PUSH (FOR BLOOD PRESSURE SUPPORT)
PREFILLED_SYRINGE | INTRAVENOUS | Status: AC
  Filled 2019-06-22: qty 10

## 2019-06-22 MED ORDER — SODIUM CHLORIDE 0.9% FLUSH
3.0000 mL | Freq: Two times a day (BID) | INTRAVENOUS | Status: DC
  Administered 2019-06-22 (×2): 3 mL via INTRAVENOUS

## 2019-06-22 MED ORDER — PHENOL 1.4 % MT LIQD: 1.0000 | OROMUCOSAL | Status: DC | PRN

## 2019-06-22 MED ORDER — SENNA 8.6 MG PO TABS
1.0000 | ORAL_TABLET | Freq: Two times a day (BID) | ORAL | Status: DC
  Administered 2019-06-22 – 2019-06-23 (×2): 8.6 mg via ORAL
  Filled 2019-06-22 (×2): qty 1

## 2019-06-22 MED ORDER — MAGNESIUM CITRATE PO SOLN
1.0000 | Freq: Once | ORAL | Status: AC
  Administered 2019-06-22: 1 via ORAL
  Filled 2019-06-22: qty 296

## 2019-06-22 MED ORDER — SODIUM CHLORIDE 0.9 % IV SOLN
250.0000 mL | INTRAVENOUS | Status: DC
  Administered 2019-06-22: 250 mL via INTRAVENOUS

## 2019-06-22 MED ORDER — THROMBIN 5000 UNITS EX SOLR
CUTANEOUS | Status: AC
  Filled 2019-06-22: qty 5000

## 2019-06-22 SURGICAL SUPPLY — 59 items
ANCHOR LUMBAR MIS 30 (Anchor) ×12 IMPLANT
APPLIER CLIP 11 MED OPEN (CLIP) ×4
BAG DECANTER FOR FLEXI CONT (MISCELLANEOUS) ×4 IMPLANT
BUR BARREL STRAIGHT FLUTE 4.0 (BURR) ×4 IMPLANT
BUR CARBIDE MATCH 3.0 (BURR) ×4 IMPLANT
CANISTER SUCT 3000ML PPV (MISCELLANEOUS) ×4 IMPLANT
CLIP APPLIE 11 MED OPEN (CLIP) ×2 IMPLANT
DERMABOND ADVANCED (GAUZE/BANDAGES/DRESSINGS) ×2
DERMABOND ADVANCED .7 DNX12 (GAUZE/BANDAGES/DRESSINGS) ×2 IMPLANT
DRAPE C-ARM 42X72 X-RAY (DRAPES) ×8 IMPLANT
DRAPE C-ARMOR (DRAPES) ×4 IMPLANT
DRAPE LAPAROTOMY 100X72X124 (DRAPES) ×4 IMPLANT
DRSG OPSITE POSTOP 4X8 (GAUZE/BANDAGES/DRESSINGS) ×4 IMPLANT
DURAPREP 26ML APPLICATOR (WOUND CARE) ×4 IMPLANT
ELECT BLADE 4.0 EZ CLEAN MEGAD (MISCELLANEOUS) ×4
ELECT REM PT RETURN 9FT ADLT (ELECTROSURGICAL) ×4
ELECTRODE BLDE 4.0 EZ CLN MEGD (MISCELLANEOUS) ×2 IMPLANT
ELECTRODE REM PT RTRN 9FT ADLT (ELECTROSURGICAL) ×2 IMPLANT
GAUZE 4X4 16PLY RFD (DISPOSABLE) IMPLANT
GAUZE SPONGE 4X4 12PLY STRL (GAUZE/BANDAGES/DRESSINGS) ×4 IMPLANT
GLOVE BIO SURGEON STRL SZ 6.5 (GLOVE) ×9 IMPLANT
GLOVE BIO SURGEON STRL SZ7 (GLOVE) ×12 IMPLANT
GLOVE BIO SURGEON STRL SZ8 (GLOVE) ×8 IMPLANT
GLOVE BIO SURGEONS STRL SZ 6.5 (GLOVE) ×3
GLOVE BIOGEL PI IND STRL 7.0 (GLOVE) ×10 IMPLANT
GLOVE BIOGEL PI IND STRL 7.5 (GLOVE) ×6 IMPLANT
GLOVE BIOGEL PI IND STRL 8 (GLOVE) ×4 IMPLANT
GLOVE BIOGEL PI INDICATOR 7.0 (GLOVE) ×10
GLOVE BIOGEL PI INDICATOR 7.5 (GLOVE) ×6
GLOVE BIOGEL PI INDICATOR 8 (GLOVE) ×4
GLOVE SS BIOGEL STRL SZ 7.5 (GLOVE) ×2 IMPLANT
GLOVE SUPERSENSE BIOGEL SZ 7.5 (GLOVE) ×2
GOWN STRL REUS W/ TWL LRG LVL3 (GOWN DISPOSABLE) ×6 IMPLANT
GOWN STRL REUS W/ TWL XL LVL3 (GOWN DISPOSABLE) ×6 IMPLANT
GOWN STRL REUS W/TWL LRG LVL3 (GOWN DISPOSABLE) ×6
GOWN STRL REUS W/TWL XL LVL3 (GOWN DISPOSABLE) ×6
GRAFT TRIN ELITE MED MUSC TRAN (Graft) ×4 IMPLANT
HEMOSTAT POWDER KIT SURGIFOAM (HEMOSTASIS) ×4 IMPLANT
KIT BASIN OR (CUSTOM PROCEDURE TRAY) ×4 IMPLANT
KIT TURNOVER KIT B (KITS) ×4 IMPLANT
NEEDLE SPNL 18GX3.5 QUINCKE PK (NEEDLE) ×4 IMPLANT
NS IRRIG 1000ML POUR BTL (IV SOLUTION) ×4 IMPLANT
PACK LAMINECTOMY NEURO (CUSTOM PROCEDURE TRAY) ×4 IMPLANT
PUTTY BONE DBX 5CC MIX (Putty) ×4 IMPLANT
SPACER HEDRON IA 29X39X15 8D (Spacer) ×4 IMPLANT
SPONGE INTESTINAL PEANUT (DISPOSABLE) ×8 IMPLANT
SPONGE LAP 18X18 RF (DISPOSABLE) ×4 IMPLANT
SPONGE LAP 4X18 RFD (DISPOSABLE) IMPLANT
SPONGE SURGIFOAM ABS GEL 100 (HEMOSTASIS) ×4 IMPLANT
STAPLER VISISTAT 35W (STAPLE) ×4 IMPLANT
SUT SILK 0 TIES 10X30 (SUTURE) ×4 IMPLANT
SUT VIC AB 0 CT1 18XCR BRD8 (SUTURE) ×2 IMPLANT
SUT VIC AB 0 CT1 8-18 (SUTURE) ×2
SUT VIC AB 2-0 CP2 18 (SUTURE) ×4 IMPLANT
SUT VIC AB 3-0 SH 8-18 (SUTURE) ×8 IMPLANT
SUT VICRYL 4-0 PS2 18IN ABS (SUTURE) IMPLANT
TOWEL GREEN STERILE (TOWEL DISPOSABLE) ×4 IMPLANT
TRAY FOLEY MTR SLVR 16FR STAT (SET/KITS/TRAYS/PACK) ×4 IMPLANT
WATER STERILE IRR 1000ML POUR (IV SOLUTION) ×4 IMPLANT

## 2019-06-22 NOTE — Op Note (Signed)
06/22/2019  10:08 AM  PATIENT:  Kimberly Knight  34 y.o. female  PRE-OPERATIVE DIAGNOSIS: Degenerative disc disease L5-S1, retrolisthesis L5-S1, disc herniation L5-S1, back and leg pain  POST-OPERATIVE DIAGNOSIS:  same  PROCEDURE: Anterior lumbar interbody fusion L5-S1 utilizing a globus porous titanium interbody cage packed with morselized allograft  SURGEON:  Sherley Bounds, MD  Co-surgeon:  Dr. Sherren Mocha early  ASSISTANTS: Glenford Peers, FNP  ANESTHESIA:   General  EBL: 100 ml  Total I/O In: 800 [I.V.:800] Out: 240 [Urine:140; Blood:100]  BLOOD ADMINISTERED: none  DRAINS: None  SPECIMEN:  none  INDICATION FOR PROCEDURE: This patient presented with back and leg pain for quite a long time. Imaging showed degenerative disc disease with retrolisthesis L5-S1 with lumbar disc herniation. The patient tried conservative measures without relief. Pain was debilitating. Recommended anterior lumbar interbody fusion. Patient understood the risks, benefits, and alternatives and potential outcomes and wished to proceed.  PROCEDURE DETAILS: The patient was taken to the operating room and after induction of adequate generalized endotracheal anesthesia she was placed in the supine position on the operating room table.  The exposure was performed by Dr. Sherren Mocha Early of vascular surgery and that will be described in a separate operative report.  Once the disc base was exposed and the [facility] retractor was in place we placed a needle in the disc space and checked an AP fluoroscopic shot to mark her midline and then checked lateral fluoroscopy to confirm we were at L5-S1 which all in the room agreed.  I then incised the disc space and released the disc from the endplate with a Cobb elevator.  We then did the initial discectomy with pituitary rongeurs.  We then scraped the endplate and prepared the endplate with a high-speed drill with a barrel bit, and drilled the endplates to prepare for arthrodesis.  I  was able to get up under the ligament and identified the dura on the left and decompress the left side because of her left leg pain.  We then irrigated with saline solution and started with our trials.  We started with an 11 mm 8 degree trial and work took to a 15 mm 8 degree trial which seemed to fit the best.  We did some fine drilling of the endplates to further shape them for reception of the graft.  We checked our graft utilizing lateral fluoroscopy.  We then used a corresponding 15 mm 8 degree graft and packed with morselized allograft and attached it to the inserter.  We then inserted the cage in the midline utilizing lateral fluoroscopy.  We then placed anchors by knocking them into the L5 vertebral body and the sacrum.  We did 1 and two down.  We then locked the anchors into position utilizing the locking mechanism on the cage.  We checked our final construct with AP and lateral fluoroscopy.  We did an AP abdominal x-ray which was read as no retained fragment instrumentation by the radiologist.  We did this x-ray after all instrumentation and retractor system was removed.  When we remove the retractor we saw no retroperitoneal bleeding.  We therefore closed the rectus fascia with a running 0 Prolene.  We closed the subcutaneous tissues with 0 Vicryl.  We closed the subcuticular tissue with 3-0 Vicryl.  The skin was closed with Dermabond.  A sterile dressing was then applied, the patient was awakened from general anesthesia and transported to the recovery room in stable condition.  At the end of the procedure all  sponge needle instrument counts were correct.  PLAN OF CARE: Admit to inpatient   PATIENT DISPOSITION:  PACU - hemodynamically stable.   Delay start of Pharmacological VTE agent (>24hrs) due to surgical blood loss or risk of bleeding:  yes

## 2019-06-22 NOTE — Transfer of Care (Signed)
Immediate Anesthesia Transfer of Care Note  Patient: Kimberly Knight  Procedure(s) Performed: LUMBAR FIVE-SACRAL ONE ANTERIOR LUMBAR INTERBODY FUSION (N/A Abdomen) ABDOMINAL EXPOSURE (N/A )  Patient Location: PACU  Anesthesia Type:General  Level of Consciousness: awake, alert  and oriented  Airway & Oxygen Therapy: Patient Spontanous Breathing and Patient connected to nasal cannula oxygen  Post-op Assessment: Report given to RN and Post -op Vital signs reviewed and stable  Post vital signs: Reviewed and stable  Last Vitals:  Vitals Value Taken Time  BP 108/55 06/22/19 1018  Temp    Pulse 90 06/22/19 1018  Resp 17 06/22/19 1018  SpO2 100 % 06/22/19 1018  Vitals shown include unvalidated device data.  Last Pain:  Vitals:   06/22/19 0608  PainSc: 2       Patients Stated Pain Goal: 1 ([id number])  Complications: No apparent anesthesia complications

## 2019-06-22 NOTE — Anesthesia Preprocedure Evaluation (Addendum)
Anesthesia Evaluation  Patient identified by MRN, date of birth, ID band Patient awake    Reviewed: Allergy & Precautions, NPO status , Patient's Chart, lab work & pertinent test results  Airway Mallampati: II  TM Distance: >3 FB Neck ROM: Full    Dental no notable dental hx. (+) Teeth Intact, Dental Advisory Given   Pulmonary neg pulmonary ROS,    Pulmonary exam normal breath sounds clear to auscultation       Cardiovascular negative cardio ROS Normal cardiovascular exam Rhythm:Regular Rate:Normal     Neuro/Psych negative neurological ROS  negative psych ROS   GI/Hepatic negative GI ROS, Neg liver ROS,   Endo/Other  negative endocrine ROS  Renal/GU negative Renal ROS  negative genitourinary   Musculoskeletal  (+) Arthritis ,   Abdominal   Peds  Hematology negative hematology ROS (+)   Anesthesia Other Findings   Reproductive/Obstetrics                            Anesthesia Physical Anesthesia Plan  ASA: II  Anesthesia Plan: General   Post-op Pain Management:    Induction: Intravenous  PONV Risk Score and Plan: 3 and Midazolam, Dexamethasone and Ondansetron  Airway Management Planned: Oral ETT  Additional Equipment:   Intra-op Plan:   Post-operative Plan: Extubation in OR  Informed Consent: I have reviewed the patients History and Physical, chart, labs and discussed the procedure including the risks, benefits and alternatives for the proposed anesthesia with the patient or authorized representative who has indicated his/her understanding and acceptance.     Dental advisory given  Plan Discussed with: CRNA  Anesthesia Plan Comments:         Anesthesia Quick Evaluation

## 2019-06-22 NOTE — H&P (Signed)
Subjective: Patient is a 34 y.o. female admitted for alif. Onset of symptoms was several months ago, unchanged since that time.  The pain is rated severe, and is located at the across the lower back and radiates to LLE. The pain is described as aching and occurs all day. The symptoms have been progressive. Symptoms are exacerbated by exercise. MRI or CT showed DDD/HNP L5-S1   Past Medical History:  Diagnosis Date  . Disc degeneration, lumbosacral   . Low blood pressure    when sick, body under stress  . Radicular pain     Past Surgical History:  Procedure Laterality Date  . hemangioma removal 1995    . TONSILLECTOMY     child    Prior to Admission medications   Medication Sig Start Date End Date Taking? Authorizing Provider  fluticasone (FLONASE) 50 MCG/ACT nasal spray One spray in each nostril twice a day, use left hand for right nostril, and right hand for left nostril. Patient taking differently: Place 1 spray into both nostrils daily as needed (congestion/allergies.).  02/05/18  Yes Silverio Decamp, MD  ibuprofen (ADVIL) 800 MG tablet Take 800 mg by mouth every 8 (eight) hours as needed (for pain.).   Yes [provider]  methocarbamol (ROBAXIN) 500 MG tablet Take 1 tablet (500 mg total) by mouth 3 (three) times daily. Patient not taking: Reported on 06/09/2019 01/12/19   Silverio Decamp, MD  triamcinolone ointment (KENALOG) 0.5 % Apply 1 application topically 2 (two) times daily. To affected area, avoid eyes and face 06/01/19   Early, Coralee Pesa, NP   No Known Allergies  Social History   Tobacco Use  . Smoking status: Never Smoker  . Smokeless tobacco: Never Used  Substance Use Topics  . Alcohol use: Yes    Comment: social    Family History  Problem Relation Age of Onset  . Cancer Mother        breast, skin  . Cancer Father        Pancreatic   . Hypertension Father      Review of Systems  Positive ROS: neg  All other systems have been reviewed  and were otherwise negative with the exception of those mentioned in the HPI and as above.  Objective: Vital signs in last 24 hours: Temp:  [98.4 F (36.9 C)] 98.4 F (36.9 C) (04/26 0556) Pulse Rate:  [74] 74 (04/26 0556) Resp:  [18] 18 (04/26 0556) BP: (106)/(54) 106/54 (04/26 0556) SpO2:  [100 %] 100 % (04/26 0556) Weight:  [83.5 kg] 83.5 kg (04/26 0556)  General Appearance: Alert, cooperative, no distress, appears stated age Head: Normocephalic, without obvious abnormality, atraumatic Eyes: PERRL, conjunctiva/corneas clear, EOM's intact    Neck: Supple, symmetrical, trachea midline Back: Symmetric, no curvature, ROM normal, no CVA tenderness Lungs:  respirations unlabored Heart: Regular rate and rhythm Abdomen: Soft, non-tender Extremities: Extremities normal, atraumatic, no cyanosis or edema Pulses: 2+ and symmetric all extremities Skin: Skin color, texture, turgor normal, no rashes or lesions  NEUROLOGIC:   Mental status: Alert and oriented x4,  no aphasia, good attention span, fund of knowledge, and memory Motor Exam - grossly normal Sensory Exam - grossly normal Reflexes: 1+ Coordination - grossly normal Gait - grossly normal Balance - grossly normal Cranial Nerves: I: smell Not tested  II: visual acuity  OS: nl    OD: nl  II: visual fields Full to confrontation  II: pupils Equal, round, reactive to light  III,VII: ptosis None  III,IV,VI: extraocular muscles  Full ROM  V: mastication Normal  V: facial light touch sensation  Normal  V,VII: corneal reflex  Present  VII: facial muscle function - upper  Normal  VII: facial muscle function - lower Normal  VIII: hearing Not tested  IX: soft palate elevation  Normal  IX,X: gag reflex Present  XI: trapezius strength  5/5  XI: sternocleidomastoid strength 5/5  XI: neck flexion strength  5/5  XII: tongue strength  Normal    Data Review Lab Results  Component Value Date   WBC 5.8 06/18/2019   HGB 12.9  06/18/2019   HCT 38.8 06/18/2019   MCV 90.4 06/18/2019   PLT 265 06/18/2019   Lab Results  Component Value Date   NA 139 06/18/2019   K 3.7 06/18/2019   CL 104 06/18/2019   CO2 27 06/18/2019   BUN 11 06/18/2019   CREATININE 0.84 06/18/2019   GLUCOSE 90 06/18/2019   Lab Results  Component Value Date   INR 1.0 06/18/2019    Assessment/Plan:  Estimated body mass index is 23.62 kg/m as calculated from the following:   Height as of this encounter: 6\' 2"  (1.88 m).   Weight as of this encounter: 83.5 kg. Patient admitted for ALIF L5-s1. Patient has failed a reasonable attempt at conservative therapy.  I explained the condition and procedure to the patient and answered any questions.  Patient wishes to proceed with procedure as planned. Understands risks/ benefits and typical outcomes of procedure.   Eustace Moore 06/22/2019 7:19 AM

## 2019-06-22 NOTE — Progress Notes (Signed)
Orthopedic Tech Progress Note Patient Details:  Kimberly Knight 1985-09-10 [id number]  Patient ID: Kimberly Knight, female   DOB: 10/29/1985, 34 y.o.   MRN: [id number] Called brace order into [facility].  Braulio Bosch 06/22/2019, 12:44 PM

## 2019-06-22 NOTE — Op Note (Signed)
    OPERATIVE REPORT  DATE OF SURGERY: 06/22/2019  PATIENT: LAKEVIA WAWRZYNIAK, 34 y.o. female MRN: [id number]  DOB: 1985-04-23  PRE-OPERATIVE DIAGNOSIS: Degenerative disc disease  POST-OPERATIVE DIAGNOSIS:  Same  PROCEDURE: Anterior exposure for L5-S1 disc fusion  SURGEON:  Curt Jews, M.D.  Co-surgeon for the exposure Dr. Sherley Bounds  ANESTHESIA: General  EBL: per anesthesia record  Total I/O In: 1100 [P.O.:50; I.V.:800; Other:150; IV Piggyback:100] Out: 300 [Urine:200; Blood:100]  BLOOD ADMINISTERED: none  DRAINS: none  SPECIMEN: none  COUNTS CORRECT:  YES  PATIENT DISPOSITION:  PACU - hemodynamically stable  PROCEDURE DETAILS: Patient was taken operating placed supine position where the area of the abdomen was prepped and draped in usual sterile fashion.  Crosstable lateral fluoroscopy was used to identify the level of the L5-S1 disc and this was marked on the skin.  An incision was made from the midline to the left and carried down through the subcutaneous fat with electrocautery.  The anterior rectus sheath was opened in line with the skin incision.  The rectus muscle was mobilized medially.  The retroperitoneal space was entered bluntly in the left lower quadrant and the intraperitoneal contents were mobilized to the right.  There was a opening in the peritoneal lining and this was closed with a 3-0 Vicryl suture.  Blunt dissection was continued to mobilize the left ureter to the right.  The tissues overlying the L5-S1 disc were mobilized to give adequate exposure to the right and left for fusion.  The iliac vessels were mobilized to the right and left.  Middle sacral vessels were clipped and divided.  The Thompson retractor was brought onto the field and the reverse lip 150 blades were positioned to the right and left of the L5-S1 disc.  Malleable retractors were used for superior and inferior exposure.  Spinal needle was placed in the L5-S1 disc space and C-arm was brought  back onto the field to confirm that this was the appropriate level.  The remainder of the procedure will be dictated as a separate note by Dr. Richardson Landry, M.D., [facility] 06/22/2019 11:35 AM

## 2019-06-22 NOTE — Evaluation (Signed)
Physical Therapy Evaluation Patient Details Name: Kimberly Knight MRN: [id number] DOB: Aug 28, 1985 Today's Date: 06/22/2019   History of Present Illness  pt is a 34 y/o female with h/o hypotension, admitted for lumbar fusion surgery due to DDD, HNP and retrolisthesis at L5 S1.  pt s/p ALIF of L5 S1 with morcelized allograft.  Clinical Impression  Pt is close to baseline functioning and should be safe at home with available assist of family.  Education was completed from a PT standpoint. There are no further acute PT needs.  Will sign off at this time.     Follow Up Recommendations No PT follow up    Equipment Recommendations  None recommended by PT    Recommendations for Other Services       Precautions / Restrictions Precautions Precautions: Back Precaution Booklet Issued: Yes (comment) Required Braces or Orthoses: Spinal Brace;Other Brace(brace not necessary, pt desires to use it.) Spinal Brace: Lumbar corset;Applied in sitting position(pt notes brace not necessary)      Mobility  Bed Mobility Overal bed mobility: Needs Assistance Bed Mobility: Rolling;Sidelying to Sit;Sit to Sidelying Rolling: Supervision       Sit to sidelying: Supervision General bed mobility comments: Instructed in log roll and transitions, pt practiced without assist   Transfers Overall transfer level: Needs assistance   Transfers: Sit to/from Stand Sit to Stand: Min guard            Ambulation/Gait Ambulation/Gait assistance: Supervision Gait Distance (Feet): 300 Feet Assistive device: None Gait Pattern/deviations: Step-through pattern Gait velocity: moderate Gait velocity interpretation: 1.31 - 2.62 ft/sec, indicative of limited community ambulator General Gait Details: safe and steady with moderate speed  Stairs Stairs: Yes Stairs assistance: Supervision Stair Management: One rail Right;Step to pattern;Forwards Number of Stairs: 7 General stair comments: safe with  rail  Wheelchair Mobility    Modified Rankin (Stroke Patients Only)       Balance Overall balance assessment: No apparent balance deficits (not formally assessed)                                           Pertinent Vitals/Pain Pain Assessment: Faces Faces Pain Scale: Hurts little more Pain Descriptors / Indicators: Guarding;Grimacing;Sore Pain Intervention(s): Monitored during session    Home Living Family/patient expects to be discharged to:: Private residence Living Arrangements: Spouse/significant other;Children Available Help at Discharge: Family;Available PRN/intermittently Type of Home: House Home Access: Stairs to enter Entrance Stairs-Rails: Right;Left Entrance Stairs-Number of Steps: several Home Layout: Two level;Able to live on main level with bedroom/bathroom Home Equipment: None      Prior Function Level of Independence: Independent         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, newspaper        Extremity/Trunk Assessment   Upper Extremity Assessment Upper Extremity Assessment: Overall WFL for tasks assessed    Lower Extremity Assessment Lower Extremity Assessment: Overall WFL for tasks assessed       Communication   Communication: No difficulties  Cognition Arousal/Alertness: Awake/alert Behavior During Therapy: WFL for tasks assessed/performed Overall Cognitive Status: Within Functional Limits for tasks assessed                                        General Comments General comments (  skin integrity, edema, etc.): pt instructed in back care/prec, log roll, lifting restrictions and progression of activity.    Exercises     Assessment/Plan    PT Assessment Patent does not need any further PT services  PT Problem List Pain;Decreased mobility       PT Treatment Interventions      PT Goals (Current goals can be found in the Care Plan section)  Acute Rehab PT Goals Patient Stated  Goal: home and back to work PT Goal Formulation: All assessment and education complete, DC therapy    Frequency     Barriers to discharge        Co-evaluation               AM-PAC PT "6 Clicks" Mobility  Outcome Measure Help needed turning from your back to your side while in a flat bed without using bedrails?: A Little Help needed moving from lying on your back to sitting on the side of a flat bed without using bedrails?: A Little Help needed moving to and from a bed to a chair (including a wheelchair)?: A Little Help needed standing up from a chair using your arms (e.g., wheelchair or bedside chair)?: A Little Help needed to walk in hospital room?: A Little Help needed climbing 3-5 steps with a railing? : A Little 6 Click Score: 18    End of Session Equipment Utilized During Treatment: Back brace(pt wants to use it ) Activity Tolerance: Patient tolerated treatment well Patient left: in bed;with call bell/phone within reach;with family/visitor present Nurse Communication: Mobility status;Precautions PT Visit Diagnosis: Other abnormalities of gait and mobility (R26.89);Pain Pain - part of body: (anterior L flank incision)    Time: [id number] PT Time Calculation (min) (ACUTE ONLY): 26 min   Charges:   PT Evaluation $PT Eval Low Complexity: 1 Low PT Treatments $Gait Training: 8-22 mins        06/22/2019  Kimberly Carne., PT Acute Rehabilitation Services [phone]  (pager) [phone]  (office)  Kimberly Knight 06/22/2019, 5:31 PM

## 2019-06-22 NOTE — Anesthesia Procedure Notes (Signed)
Procedure Name: Intubation Date/Time: 06/22/2019 7:50 AM Performed by: Trinna Post., CRNA Pre-anesthesia Checklist: Patient identified, Emergency Drugs available, Suction available, Patient being monitored and Timeout performed Patient Re-evaluated:Patient Re-evaluated prior to induction Oxygen Delivery Method: Circle system utilized Preoxygenation: Pre-oxygenation with 100% oxygen Induction Type: IV induction Ventilation: Mask ventilation without difficulty Laryngoscope Size: Miller and 2 Grade View: Grade I Tube type: Oral Tube size: 7.0 mm Number of attempts: 1 Airway Equipment and Method: Stylet Placement Confirmation: ETT inserted through vocal cords under direct vision,  positive ETCO2 and breath sounds checked- equal and bilateral Secured at: 22 cm Tube secured with: Tape Dental Injury: Teeth and Oropharynx as per pre-operative assessment

## 2019-06-23 ENCOUNTER — Encounter: Payer: Self-pay | Admitting: *Deleted

## 2019-06-23 MED ORDER — METHOCARBAMOL 500 MG PO TABS: 500.0000 mg | ORAL_TABLET | Freq: Four times a day (QID) | ORAL | 1 refills | Status: DC | PRN

## 2019-06-23 MED ORDER — CELECOXIB 200 MG PO CAPS: 200.0000 mg | ORAL_CAPSULE | Freq: Two times a day (BID) | ORAL | 0 refills | Status: DC

## 2019-06-23 MED ORDER — OXYCODONE HCL 10 MG PO TABS: 10.0000 mg | ORAL_TABLET | ORAL | 0 refills | Status: DC | PRN

## 2019-06-23 NOTE — Evaluation (Signed)
Occupational Therapy Evaluation and Discharge Patient Details Name: Kimberly Knight MRN: [id number] DOB: January 28, 1986 Today's Date: 06/23/2019    History of Present Illness pt is a 34 y/o female with h/o hypotension, admitted for lumbar fusion surgery due to DDD, HNP and retrolisthesis at L5 S1.  pt s/p ALIF of L5 S1 with morcelized allograft.   Clinical Impression   OT evaluation completed with no additional OT needs identified. PTA pt independent with ADL, iADL and functional mobility. Today Pt completing functional transfers and room/hallway distance mobility without AD, LB and UB ADL at mod independent-supervision level throughout. Pt able to recall 3/3 back precautions and with good carryover of precautions given min cues during functional tasks. Educated and further reviewed with pt re: back precautions, safety and compensatory techniques for completing ADL and mobility tasks while maintaining precautions with pt verbalizing/return demonstrating understanding. Pt reports plans to return home with intermittent assist from spouse/family. All questions answered with no further OT needs at this time. Acute OT to sign off, thank you for this referral.     Follow Up Recommendations  No OT follow up;Supervision - Intermittent    Equipment Recommendations  None recommended by OT           Precautions / Restrictions Precautions Precautions: Back Precaution Booklet Issued: Yes (comment) Precaution Comments: able to recall 3/3 precautions Required Braces or Orthoses: Spinal Brace;Other Brace(brace not necessary, pt desires to use it.) Spinal Brace: Lumbar corset;Applied in sitting position(pt notes brace not necessary) Restrictions Weight Bearing Restrictions: No      Mobility Bed Mobility Overal bed mobility: Needs Assistance Bed Mobility: Rolling;Sidelying to Sit;Sit to Sidelying Rolling: Supervision Sidelying to sit: Supervision       General bed mobility comments: pt with good  recall of log roll technique, completed from flat bed with min use of bedrail   Transfers Overall transfer level: Needs assistance Equipment used: None Transfers: Sit to/from Stand Sit to Stand: Supervision         General transfer comment: for safety    Balance Overall balance assessment: No apparent balance deficits (not formally assessed)                                         ADL either performed or assessed with clinical judgement   ADL Overall ADL's : Needs assistance/impaired Eating/Feeding: Modified independent;Sitting   Grooming: Supervision/safety;Standing   Upper Body Bathing: Modified independent;Sitting;Standing   Lower Body Bathing: Supervison/ safety;Sit to/from stand Lower Body Bathing Details (indicate cue type and reason): educated in use of AE for task completion Upper Body Dressing : Modified independent;Sitting Upper Body Dressing Details (indicate cue type and reason): donning overhead shirt/bra Lower Body Dressing: Supervision/safety;Sit to/from stand Lower Body Dressing Details (indicate cue type and reason): donning pants, underwear, socks and shoes; educated in compensatory techniques to ensure carryover of back precautions, pt able to return demonstrate and using figure 4 technique during task completion  Toilet Transfer: Supervision/safety;Ambulation Toilet Transfer Details (indicate cue type and reason): simulated via transfer to/from EOB, reports no difficulty with transfers to/from toilet in pt bathroom  Toileting- Clothing Manipulation and Hygiene: Supervision/safety;Sit to/from stand Toileting - Clothing Manipulation Details (indicate cue type and reason): discussed safety and to avoid twisting, educated in option of AE if needed     Functional mobility during ADLs: Supervision/safety General ADL Comments: pt with good understanding/carryover of back precautions given min  cues                          Pertinent  Vitals/Pain Pain Assessment: Faces Faces Pain Scale: Hurts little more Pain Location: incisional, L hip/groin region Pain Descriptors / Indicators: Guarding;Grimacing;Sore Pain Intervention(s): Limited activity within patient's tolerance;Monitored during session;Repositioned     Hand Dominance     Extremity/Trunk Assessment Upper Extremity Assessment Upper Extremity Assessment: Overall WFL for tasks assessed   Lower Extremity Assessment Lower Extremity Assessment: Defer to PT evaluation;Overall [facility] for tasks assessed   Cervical / Trunk Assessment Cervical / Trunk Assessment: Other exceptions Cervical / Trunk Exceptions: s/p lumbar sx   Communication Communication Communication: No difficulties   Cognition Arousal/Alertness: Awake/alert Behavior During Therapy: WFL for tasks assessed/performed Overall Cognitive Status: Within Functional Limits for tasks assessed                                     General Comments       Exercises     Shoulder Instructions      Home Living Family/patient expects to be discharged to:: Private residence Living Arrangements: Spouse/significant other;Children Available Help at Discharge: Family;Available PRN/intermittently Type of Home: House Home Access: Stairs to enter [employer] of Steps: several Entrance Stairs-Rails: Right;Left Home Layout: Two level;Able to live on main level with bedroom/bathroom Alternate Level Stairs-Number of Steps: flight   Bathroom Shower/Tub: Occupational psychologist: Standard     Home Equipment: None          Prior Functioning/Environment Level of Independence: Independent        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        OT Problem List: Decreased strength;Decreased range of motion;Impaired balance (sitting and/or standing);Decreased knowledge of precautions;Decreased knowledge of use of DME or AE;Pain;Decreased activity tolerance      OT  Treatment/Interventions:      OT Goals(Current goals can be found in the care plan section) Acute Rehab OT Goals Patient Stated Goal: home and back to work OT Goal Formulation: With patient Time For Goal Achievement: 07/07/19 Potential to Achieve Goals: Good  OT Frequency:     Barriers to D/C:            Co-evaluation              AM-PAC OT "6 Clicks" Daily Activity     Outcome Measure Help from another person eating meals?: None Help from another person taking care of personal grooming?: A Little Help from another person toileting, which includes using toliet, bedpan, or urinal?: A Little Help from another person bathing (including washing, rinsing, drying)?: A Little Help from another person to put on and taking off regular upper body clothing?: None Help from another person to put on and taking off regular lower body clothing?: A Little 6 Click Score: 20   End of Session Nurse Communication: Mobility status  Activity Tolerance: Patient tolerated treatment well Patient left: in bed;with call bell/phone within reach  OT Visit Diagnosis: Other abnormalities of gait and mobility (R26.89);Pain Pain - part of body: (back)                Time: [id number] OT Time Calculation (min): 22 min Charges:  OT General Charges $OT Visit: 1 Visit OT Evaluation $OT Eval Low Complexity: [place], OT Acute Rehabilitation Services Pager [phone] Office  [phone]   Raymondo Band 06/23/2019, 9:47 AM

## 2019-06-23 NOTE — Discharge Summary (Signed)
Physician Discharge Summary  Patient ID: Kimberly Knight MRN: [id number] DOB/AGE: 1985-04-17 34 y.o.  Admit date: 06/22/2019 Discharge date: 06/23/2019  Admission Diagnoses: DDD/ HNP L5-S1    Discharge Diagnoses: same   Discharged Condition: good  Hospital Course: The patient was admitted on 06/22/2019 and taken to the operating room where the patient underwent ALIF L5-S1. The patient tolerated the procedure well and was taken to the recovery room and then to the floor in stable condition. The hospital course was routine. There were no complications. The wound remained clean dry and intact. Pt had appropriate back soreness. No complaints of leg pain or new N/T/W. The patient remained afebrile with stable vital signs, and tolerated a regular diet. The patient continued to increase activities, and pain was well controlled with oral pain medications.   Consults: None  Significant Diagnostic Studies:  Results for orders placed or performed during the hospital encounter of 06/22/19  Pregnancy, urine POC  Result Value Ref Range   Preg Test, Ur NEGATIVE NEGATIVE    Chest 2 View  Result Date: 06/19/2019 CLINICAL DATA:  Preoperative chest radiograph. Patient is scheduled for oral L5-S1 surgery. EXAM: CHEST - 2 VIEW COMPARISON:  None. FINDINGS: The heart size and mediastinal contours are within normal limits. Both lungs are clear. No pneumothorax or pleural effusion. The visualized skeletal structures are unremarkable. IMPRESSION: No acute cardiopulmonary finding. Electronically Signed   By: Audie Pinto M.D.   On: 06/19/2019 08:15   DG Lumbar Spine 2-3 Views  Result Date: 06/22/2019 CLINICAL DATA:  34 year old female undergoing L5-S1 ALIF. EXAM: LUMBAR SPINE - 2-3 VIEW; DG C-ARM 1-60 MIN COMPARISON:  Lumbar radiographs 03/16/2015.  Lumbar MRI 05/18/2019. FINDINGS: Two intraoperative fluoroscopic spot views of the lumbosacral junction in both the anterior and lateral projection. Anterior  approach interbody fusion hardware is in place. Hardware appears intact. FLUOROSCOPY TIME:  0 minutes 51 seconds IMPRESSION: Anterior interbody fusion underway at L5-S1. Electronically Signed   By: Genevie Ann M.D.   On: 06/22/2019 11:23   DG C-Arm 1-60 Min  Result Date: 06/22/2019 CLINICAL DATA:  34 year old female undergoing L5-S1 ALIF. EXAM: LUMBAR SPINE - 2-3 VIEW; DG C-ARM 1-60 MIN COMPARISON:  Lumbar radiographs 03/16/2015.  Lumbar MRI 05/18/2019. FINDINGS: Two intraoperative fluoroscopic spot views of the lumbosacral junction in both the anterior and lateral projection. Anterior approach interbody fusion hardware is in place. Hardware appears intact. FLUOROSCOPY TIME:  0 minutes 51 seconds IMPRESSION: Anterior interbody fusion underway at L5-S1. Electronically Signed   By: Genevie Ann M.D.   On: 06/22/2019 11:23   DG OR LOCAL ABDOMEN  Result Date: 06/22/2019 CLINICAL DATA:  Status post L5-S1 ALIF EXAM: OR LOCAL ABDOMEN COMPARISON:  None. FINDINGS: Surgical hardware from ALIF overlies the L5-S1 disc space. Curvilinear metallic device overlies the left hip, reportedly the bed remote connector external to the patient. No additional radiopaque foreign bodies. No focal osseous lesions. IMPRESSION: Surgical hardware from ALIF overlies the L5-S1 disc space. Curvilinear metallic device overlying the left hip is reportedly external to the patient, representing bed remote connector. No additional radiopaque foreign bodies on this single intraoperative radiograph. These results were called by telephone at the time of interpretation on 06/22/2019 at 10:00 am to provider Beth Spackman Ronnald Ramp , who verbally acknowledged these results. Electronically Signed   By: Ilona Sorrel M.D.   On: 06/22/2019 10:04    Antibiotics:  Anti-infectives (From admission, onward)   Start     Dose/Rate Route Frequency Ordered Stop   06/22/19 1600  ceFAZolin (ANCEF) IVPB 2g/100 mL premix     2 g 200 mL/hr over 30 Minutes Intravenous Every 8 hours  06/22/19 1206 06/23/19 0026   06/22/19 0857  bacitracin 50,000 Units in sodium chloride 0.9 % 500 mL irrigation  Status:  Discontinued       As needed 06/22/19 0857 06/22/19 1014   06/22/19 0600  ceFAZolin (ANCEF) IVPB 2g/100 mL premix     2 g 200 mL/hr over 30 Minutes Intravenous On call to O.R. 06/22/19 0549 06/22/19 0751      Discharge Exam: Blood pressure (!) 97/49, pulse 74, temperature 98.5 F (36.9 C), temperature source Oral, resp. rate 16, height 6\' 2"  (1.88 m), weight 83.5 kg, last menstrual period 06/17/2019, SpO2 100 %. Neurologic: Grossly normal Dressing dry  Discharge Medications:   Allergies as of 06/23/2019   No Known Allergies     Medication List    TAKE these medications   celecoxib 200 MG capsule Commonly known as: CELEBREX Take 1 capsule (200 mg total) by mouth 2 (two) times daily.   fluticasone 50 MCG/ACT nasal spray Commonly known as: Flonase One spray in each nostril twice a day, use left hand for right nostril, and right hand for left nostril. What changed:   how much to take  how to take this  when to take this  reasons to take this  additional instructions   ibuprofen 800 MG tablet Commonly known as: ADVIL Take 800 mg by mouth every 8 (eight) hours as needed (for pain.).   methocarbamol 500 MG tablet Commonly known as: ROBAXIN Take 1 tablet (500 mg total) by mouth 3 (three) times daily. What changed: Another medication with the same name was added. Make sure you understand how and when to take each.   methocarbamol 500 MG tablet Commonly known as: ROBAXIN Take 1 tablet (500 mg total) by mouth every 6 (six) hours as needed for muscle spasms. What changed: You were already taking a medication with the same name, and this prescription was added. Make sure you understand how and when to take each.   Oxycodone HCl 10 MG Tabs Take 1 tablet (10 mg total) by mouth every 4 (four) hours as needed for severe pain ((score 7 to 10)).    triamcinolone ointment 0.5 % Commonly known as: KENALOG Apply 1 application topically 2 (two) times daily. To affected area, avoid eyes and face            Durable Medical Equipment  (From admission, onward)         Start     Ordered   06/22/19 1207  DME Walker rolling  Once    Question:  Patient needs a walker to treat with the following condition  Answer:  S/P lumbar fusion   06/22/19 1206   06/22/19 1207  DME 3 n 1  Once     06/22/19 1206          Disposition: home   Final Dx: ALIF L5-S1  Discharge Instructions     Remove dressing in 72 hours   Complete by: As directed    Call MD for:  difficulty breathing, headache or visual disturbances   Complete by: As directed    Call MD for:  persistant nausea and vomiting   Complete by: As directed    Call MD for:  redness, tenderness, or signs of infection (pain, swelling, redness, odor or green/yellow discharge around incision site)   Complete by: As directed    Call MD  for:  severe uncontrolled pain   Complete by: As directed    Call MD for:  temperature >100.4   Complete by: As directed    Diet - low sodium heart healthy   Complete by: As directed    Increase activity slowly   Complete by: As directed          Signed: Eustace Moore 06/23/2019, 7:37 AM

## 2019-06-23 NOTE — Progress Notes (Addendum)
  Progress Note    06/23/2019 7:38 AM 1 Day Post-Op  Subjective:  Some soreness at incision-has ice pack in place    Vitals:   06/23/19 0416 06/23/19 0713  BP: (!) 98/56 (!) 97/49  Pulse: 76 74  Resp: 20 16  Temp: 98.4 F (36.9 C) 98.5 F (36.9 C)  SpO2: 99% 100%    Physical Exam: Incisions:  Clean and dry with boneycomb dressing in place Extremities:  Palpable DP pulses bilaterally Abdomen:  Soft, NT/ND; +flatus  CBC    Component Value Date/Time   WBC 5.8 06/18/2019 1348   RBC 4.29 06/18/2019 1348   HGB 12.9 06/18/2019 1348   HCT 38.8 06/18/2019 1348   PLT 265 06/18/2019 1348   MCV 90.4 06/18/2019 1348   MCH 30.1 06/18/2019 1348   MCHC 33.2 06/18/2019 1348   RDW 12.1 06/18/2019 1348   LYMPHSABS 2.0 06/18/2019 1348   MONOABS 0.5 06/18/2019 1348   EOSABS 0.1 06/18/2019 1348   BASOSABS 0.0 06/18/2019 1348    BMET    Component Value Date/Time   NA 139 06/18/2019 1348   K 3.7 06/18/2019 1348   CL 104 06/18/2019 1348   CO2 27 06/18/2019 1348   GLUCOSE 90 06/18/2019 1348   BUN 11 06/18/2019 1348   CREATININE 0.84 06/18/2019 1348   CREATININE 0.86 06/16/2018 0848   CALCIUM 9.4 06/18/2019 1348   GFRNONAA >60 06/18/2019 1348   GFRNONAA 88 06/16/2018 0848   GFRAA >60 06/18/2019 1348   GFRAA 102 06/16/2018 0848    INR    Component Value Date/Time   INR 1.0 06/18/2019 1348     Intake/Output Summary (Last 24 hours) at 06/23/2019 0738 Last data filed at 06/23/2019 [id number] Gross per 24 hour  Intake 1880 ml  Output 300 ml  Net 1580 ml     Assessment:  34 y.o. female is s/p:  Anterior exposure for L5-S1 disc fusion  1 Day Post-Op  Plan: -doing well this am -palpable distal pulses -abdomen soft and passing flatus -f/u with Dr. Donnetta Hutching as needed. -shower daily once bandage is removed    Leontine Locket, PA-C [facility] [phone] 06/23/2019 7:38 AM  I have examined the patient, reviewed and agree with above.  Looks good  overall.  No nausea.  Easily palpable dorsalis pedis pulse bilaterally.  Plan for discharge today.  Follow-up with [place] as needed  Curt Jews, MD 06/23/2019 8:09 AM

## 2019-06-23 NOTE — Anesthesia Postprocedure Evaluation (Signed)
Anesthesia Post Note  Patient: Kimberly Knight  Procedure(s) Performed: LUMBAR FIVE-SACRAL ONE ANTERIOR LUMBAR INTERBODY FUSION (N/A Abdomen) ABDOMINAL EXPOSURE (N/A )     Patient location during evaluation: PACU Anesthesia Type: General Level of consciousness: awake and alert Pain management: pain level controlled Vital Signs Assessment: post-procedure vital signs reviewed and stable Respiratory status: spontaneous breathing, nonlabored ventilation, respiratory function stable and patient connected to nasal cannula oxygen Cardiovascular status: blood pressure returned to baseline and stable Postop Assessment: no apparent nausea or vomiting Anesthetic complications: no    Last Vitals:  Vitals:   06/23/19 0416 06/23/19 0713  BP: (!) 98/56 (!) 97/49  Pulse: 76 74  Resp: 20 16  Temp: 36.9 C 36.9 C  SpO2: 99% 100%    Last Pain:  Vitals:   06/23/19 0713  TempSrc: Oral  PainSc:    Pain Goal: Patients Stated Pain Goal: 1 (06/22/19 0608)                 Alexandar Weisenberger L Lavone Weisel

## 2019-06-23 NOTE — Plan of Care (Signed)
Patient alert and oriented, mae's well, voiding adequate amount of urine, swallowing without difficulty, no c/o pain at time of discharge. Patient discharged home with family. Script and discharged instructions given to patient. Patient and family stated understanding of instructions given. Patient has an appointment with Dr. Jones °

## 2019-06-23 NOTE — Discharge Instructions (Signed)

## 2019-06-24 MED FILL — Heparin Sodium (Porcine) Inj 1000 Unit/ML: INTRAMUSCULAR | Qty: 30 | Status: AC

## 2019-06-24 MED FILL — Sodium Chloride IV Soln 0.9%: INTRAVENOUS | Qty: 1000 | Status: AC

## 2019-06-25 ENCOUNTER — Ambulatory Visit: Payer: No Typology Code available for payment source | Admitting: Physician Assistant

## 2019-06-25 ENCOUNTER — Telehealth: Payer: Self-pay | Admitting: Family Medicine

## 2019-06-25 ENCOUNTER — Other Ambulatory Visit: Payer: Self-pay | Admitting: *Deleted

## 2019-06-25 DIAGNOSIS — R1032 Left lower quadrant pain: Secondary | ICD-10-CM

## 2019-06-25 DIAGNOSIS — R1900 Intra-abdominal and pelvic swelling, mass and lump, unspecified site: Secondary | ICD-10-CM

## 2019-06-25 NOTE — Patient Outreach (Signed)
[facility]) Care Management  06/25/2019  [name] 1985/06/15 [id number]   Transition of care telephone call  Referral received:06/23/19 Initial outreach:06/25/19 Insurance: [place]   Initial unsuccessful telephone call to patient's preferred number in order to complete transition of care assessment; no answer, left HIPAA compliant voicemail message requesting return call.   Objective: Per electronic record Kimberly Knight  was hospitalized at [facility]  from 4/26-4/27/21 for Lumbar 5 to sacral one anterior lumbar interbody fusion . Comorbidities include: Discogenic low back pain .  She was discharged to home on 4/27/21without the need for home health services or DME.    Plan: This RNCM will route unsuccessful outreach letter with [place] Management pamphlet and 24 hour Nurse Advice Line Magnet to [place] [facility] clinical pool to be mailed to patient's home address. This RNCM will attempt another outreach within 4 business days.  Joylene Draft, RN, BSN  [place] Management Coordinator  [phone]- Mobile [phone]- Toll Free Main Office

## 2019-06-25 NOTE — Telephone Encounter (Signed)
Spoke with Kimberly Knight last night.  She just had lumbar fusion surgery on April 26.  Since being home she has not been able to have a bowel bowel movement.  She has been passing some gas.  She has done 2 rounds of mag citrate and has actually taken 4 packets of MiraLAX.  She has a lot of abdominal swelling on the left side of the abdomen she says it is quite noticeable.  She has had some increased pain today compared to the last 2 days.  No fevers.  Hopefully will have bowel movement overnight or first thing in the morning if not we could consider at least getting a KUB just to rule out any type of bowel perforation especially if she is having increased pain and swelling.  Also consider just inflammation of the tissue because of the surgery.  Though also consider a hematoma.

## 2019-06-30 ENCOUNTER — Other Ambulatory Visit: Payer: Self-pay | Admitting: *Deleted

## 2019-06-30 NOTE — Patient Outreach (Signed)
[facility]) Care Management  06/30/2019  LARAE BARAHONA 03-Jul-1985 [id number]   Transition of care call Referral received: 06/23/19 Initial outreach attempt: 06/25/19 Insurance: [employer] plan  2nd unsuccessful telephone call to patient's preferred contact number in order to complete post hospital discharge transition of care assessment , no answer left HIPAA compliant message requesting return call.   Objective: Per electronic record Donnita Cardinale was hospitalized [facility]  from 4/26-4/27/21 for Lumbar 5 to sacral one anterior lumbar interbody fusion . Comorbidities include: Discogenic low back pain .  She was discharged to home on 06/23/19 without the need for home health servicesor DME.  Plan If no return call from patient will attempt 3rd outreach in the next 4 business days.   Joylene Draft, RN, BSN  [place] Management Coordinator  [phone]- Mobile [phone]- Toll Free Main Office

## 2019-07-03 ENCOUNTER — Encounter: Payer: Self-pay | Admitting: *Deleted

## 2019-07-03 ENCOUNTER — Other Ambulatory Visit: Payer: Self-pay | Admitting: *Deleted

## 2019-07-03 ENCOUNTER — Telehealth: Payer: Self-pay | Admitting: Family Medicine

## 2019-07-03 MED ORDER — TRAMADOL HCL 50 MG PO TABS: 50.0000 mg | ORAL_TABLET | Freq: Three times a day (TID) | ORAL | 0 refills | Status: AC | PRN

## 2019-07-03 NOTE — Telephone Encounter (Signed)
Pt called and pain has been worse the last couple of days after surgery. Using Tylenol. Has f/u on Tuesday. Hs been walking 2 miles per day on 1/4 spurts.  Tramadol sent for TID PRN use and encouraged to dec walking to maybe 1 mile total per day until f/u with surgery.

## 2019-07-03 NOTE — Patient Outreach (Addendum)
[facility]) Care Management  07/03/2019  Kimberly Knight Nov 25, 1985 [id number]   Transition of care call/case closure   Referral received:06/23/19 Initial outreach:06/25/19 Insurance: [place] [employer]   Subjective: Successful 3rd  call to patient's preferred number in order to complete transition of care assessment; 2 HIPAA identifiers verified. Explained purpose of call and completed transition of care assessment.  Kimberly Knight states that she is doing okay,  denies post-operative problems, says surgical incisions are unremarkable, states surgical pain well managed with prescribed medications. She is  tolerating diet, denies bowel or bladder problems, using miralax and colace as needed. She reports tolerating mobility well wearing brace when up, using some assistance with getting up from bed or sitting at times, using back precautions.   Spouse are assisting with her recovery.   She does not have the hospital indemnity She does not use  a [facility] outpatient pharmacy.  .    Objective:   [facility] 5 to sacral one anterior lumbar interbody fusion .Comorbidities include:Discogenic low back pain . Shewas discharged to home on4/27/21 without the need for home health servicesor DME. Assessment:  Patient voices good understanding of all discharge instructions.  See transition of care flowsheet for assessment details.   Plan:  Reviewed hospital discharge diagnosis of Lumbar 5 to sacral one anterior lumbar interbody fusion    and discharge treatment plan using hospital discharge instructions, assessing medication adherence, reviewing problems requiring provider notification, and discussing the importance of follow up with surgeon, primary care provider and/or specialists as directed. Reviewed Kimberly Knight healthy lifestyle program information to receive discounted premium for  2022   Step 1: Get  your annual  physical  Step 2: Complete your health assessment Step 3:Identify your current health status and complete the corresponding action step between January 1, and October 28, 2019.   No ongoing care management needs identified so will close case to [place] Management services . Patient has been routed outreach letter on initial outreach call date.   Thanked patient for their services to [facility].  Joylene Draft, RN, BSN  [place] Management Coordinator  [phone]- Mobile [phone]- Toll Free Main Office

## 2019-08-21 ENCOUNTER — Other Ambulatory Visit: Payer: Self-pay | Admitting: Nurse Practitioner

## 2019-08-21 DIAGNOSIS — Z1231 Encounter for screening mammogram for malignant neoplasm of breast: Secondary | ICD-10-CM

## 2019-09-03 ENCOUNTER — Ambulatory Visit: Payer: No Typology Code available for payment source | Admitting: Physician Assistant

## 2019-09-11 ENCOUNTER — Telehealth: Payer: Self-pay | Admitting: Family Medicine

## 2019-09-11 MED ORDER — ONDANSETRON 4 MG PO TBDP
4.0000 mg | ORAL_TABLET | Freq: Three times a day (TID) | ORAL | 0 refills | Status: DC | PRN
Start: 2019-09-11 — End: 2019-12-03

## 2019-09-11 NOTE — Telephone Encounter (Signed)
Patient reports sudden onset of nausea while at work.  Son was vomiting most of last night and daughter had vomiting earlier this week.  Likely gastroenteritis.  Zofran sent to pharmacy.  Please call or return if any new or worsening symptoms or not resolving after 24 hours.

## 2019-10-14 ENCOUNTER — Other Ambulatory Visit: Payer: Self-pay | Admitting: Nurse Practitioner

## 2019-10-14 DIAGNOSIS — B3731 Acute candidiasis of vulva and vagina: Secondary | ICD-10-CM

## 2019-10-14 DIAGNOSIS — B373 Candidiasis of vulva and vagina: Secondary | ICD-10-CM

## 2019-10-14 MED ORDER — FLUCONAZOLE 150 MG PO TABS: 150.0000 mg | ORAL_TABLET | Freq: Once | ORAL | 1 refills | Status: AC

## 2019-10-14 MED FILL — FLUCONAZOLE 150 MG TABS: 150 | 2 days supply | Qty: 2 | Fill #0

## 2019-12-01 ENCOUNTER — Other Ambulatory Visit: Payer: Self-pay | Admitting: Neurology

## 2019-12-01 DIAGNOSIS — L989 Disorder of the skin and subcutaneous tissue, unspecified: Secondary | ICD-10-CM

## 2019-12-03 ENCOUNTER — Encounter: Payer: Self-pay | Admitting: Physician Assistant

## 2019-12-03 ENCOUNTER — Ambulatory Visit (INDEPENDENT_AMBULATORY_CARE_PROVIDER_SITE_OTHER): Payer: No Typology Code available for payment source | Admitting: Physician Assistant

## 2019-12-03 ENCOUNTER — Encounter: Payer: Self-pay | Admitting: Neurology

## 2019-12-03 ENCOUNTER — Other Ambulatory Visit: Payer: Self-pay

## 2019-12-03 DIAGNOSIS — Z1283 Encounter for screening for malignant neoplasm of skin: Secondary | ICD-10-CM

## 2019-12-03 DIAGNOSIS — D2262 Melanocytic nevi of left upper limb, including shoulder: Secondary | ICD-10-CM | POA: Diagnosis not present

## 2019-12-03 DIAGNOSIS — D2239 Melanocytic nevi of other parts of face: Secondary | ICD-10-CM

## 2019-12-03 DIAGNOSIS — L918 Other hypertrophic disorders of the skin: Secondary | ICD-10-CM

## 2019-12-03 DIAGNOSIS — D229 Melanocytic nevi, unspecified: Secondary | ICD-10-CM

## 2019-12-03 NOTE — Progress Notes (Signed)
   New Patient Visit  Subjective  Kimberly Knight is a 34 y.o. female who presents for the following: Skin Problem (confirm nose is fibrous papule. Also check  growth above right eye and left upper arm mole. ). Spot on the tip of the left nose. Present greater than 1 year. Doesn't itch or bleed and hasn't grown. tag near eyelid. Mole left upper arm present for years but she does feel like its growing and the edge has changed. Mom had melanoma on her leg.  Objective  Well appearing patient in no apparent distress; mood and affect are within normal limits.  An examination of her arms, chest, back and face were preformed.No suspicious moles noted on back.  Objective  Left Upper Arm - Anterior: Brown macule with slighlty irregular shape. Dermatoscopically the lesion has even color and consistent cell structure.   Images    Objective  Left Supratip of Nose: Flesh colored papule. No grey color, it is not translucent and no telangiectasia visible  Objective  Left Buccal Cheek : Upper body skin exam.  Objective  Right Upper Eyelid: She has a tiny flesh colored papule along her upper eyelid margin.   Assessment & Plan  Nevus Left Upper Arm - Anterior  Advised that we cannot be 100% sure without a biopsy. Patient was able to look at lesion through the dermatoscope. Patient chose to watch spot. She will call if there are any changes.   Fibrous papule of nose Left Supratip of Nose  Advised cannot be 100% sure of diagnosis without biopsy. Patient is comfortable to watch she will call if there is any scabbing or bleeding.  Skin exam for malignant neoplasm Left Buccal Cheek   Skin tag Right Upper Eyelid  We discussed snipping it but that she would loose eyelashes. She wants to hold off on this right now.

## 2020-01-25 ENCOUNTER — Other Ambulatory Visit: Payer: Self-pay | Admitting: Neurology

## 2020-01-25 DIAGNOSIS — N760 Acute vaginitis: Secondary | ICD-10-CM

## 2020-01-27 LAB — WET PREP FOR TRICH, YEAST, CLUE
MICRO NUMBER:: 11257144
Specimen Quality: ADEQUATE

## 2020-01-27 NOTE — Progress Notes (Signed)
All labs are normal. 

## 2020-02-18 ENCOUNTER — Other Ambulatory Visit: Payer: Self-pay

## 2020-02-18 ENCOUNTER — Other Ambulatory Visit (HOSPITAL_COMMUNITY)
Admission: RE | Admit: 2020-02-18 | Discharge: 2020-02-18 | Disposition: A | Discharge status: Home / Self Care | Payer: No Typology Code available for payment source | Source: Ambulatory Visit | Attending: Obstetrics and Gynecology | Admitting: Obstetrics and Gynecology

## 2020-02-18 ENCOUNTER — Ambulatory Visit (INDEPENDENT_AMBULATORY_CARE_PROVIDER_SITE_OTHER): Payer: No Typology Code available for payment source | Admitting: Obstetrics and Gynecology

## 2020-02-18 ENCOUNTER — Encounter: Payer: Self-pay | Admitting: Obstetrics and Gynecology

## 2020-02-18 VITALS — BP 107/65 | HR 80 | Ht 73.0 in | Wt 186.0 lb

## 2020-02-18 DIAGNOSIS — N898 Other specified noninflammatory disorders of vagina: Secondary | ICD-10-CM

## 2020-02-18 DIAGNOSIS — Z124 Encounter for screening for malignant neoplasm of cervix: Secondary | ICD-10-CM | POA: Diagnosis not present

## 2020-02-18 DIAGNOSIS — Z01419 Encounter for gynecological examination (general) (routine) without abnormal findings: Secondary | ICD-10-CM | POA: Insufficient documentation

## 2020-02-18 DIAGNOSIS — Z113 Encounter for screening for infections with a predominantly sexual mode of transmission: Secondary | ICD-10-CM | POA: Diagnosis not present

## 2020-02-18 DIAGNOSIS — Z803 Family history of malignant neoplasm of breast: Secondary | ICD-10-CM

## 2020-02-18 NOTE — Progress Notes (Signed)
GYNECOLOGY ANNUAL PREVENTATIVE CARE ENCOUNTER NOTE  Subjective:   Kimberly Knight is a 34 y.o. G33P2012 female here for a annual gynecologic exam. Current complaints: increased discharge in the week after her period, no odor or discomfort, self-treats with boric acid which improves symptoms. Has increased in recent years.    Denies abnormal vaginal bleeding, discharge, pelvic pain, problems with intercourse or other gynecologic concerns. Accepts STI screen.   Gynecologic History Patient's last menstrual period was 02/11/2020. Contraception: vasectomy Last Pap: 2015. Results: normal Last mammogram: n/a  Obstetric History OB History  Gravida Para Term Preterm AB Living  3 2 2   1 2   SAB IAB Ectopic Multiple Live Births  1     0 2    # Outcome Date GA Lbr Len/2nd Weight Sex Delivery Anes PTL Lv  3 Term 07/08/14 [redacted]w[redacted]d 01:17 / 00:23 9 lb 11.9 oz (4.42 kg) F Vag-Spont EPI  LIV  2 Term 09/01/10 [redacted]w[redacted]d  10 lb 8 oz (4.763 kg) F Vag-Spont EPI N LIV     Birth Comments: induction  1 SAB             Past Medical History:  Diagnosis Date  . Disc degeneration, lumbosacral   . Low blood pressure    when sick, body under stress  . Radicular pain     Past Surgical History:  Procedure Laterality Date  . ABDOMINAL EXPOSURE N/A 06/22/2019   Procedure: ABDOMINAL EXPOSURE;  Surgeon: Rosetta Posner, MD;  Location: [facility];  Service: Vascular;  Laterality: N/A;  . ANTERIOR LUMBAR FUSION N/A 06/22/2019   Procedure: LUMBAR FIVE-SACRAL ONE ANTERIOR LUMBAR INTERBODY FUSION;  Surgeon: Eustace Moore, MD;  Location: [facility];  Service: Neurosurgery;  Laterality: N/A;  . hemangioma removal 1995    . TONSILLECTOMY     child    No current outpatient medications on file prior to visit.   No current facility-administered medications on file prior to visit.    No Known Allergies  Social History   Socioeconomic History  . Marital status: Married    Spouse name: Michael Boston  . Number of children: 3  .  Years of education: PA  . Highest education level: Professional school degree (e.g., MD, DDS, DVM, JD)  Occupational History  . Not on file  Tobacco Use  . Smoking status: Never Smoker  . Smokeless tobacco: Never Used  Vaping Use  . Vaping Use: Never used  Substance and Sexual Activity  . Alcohol use: Yes    Comment: social  . Drug use: No  . Sexual activity: Yes    Partners: Male  Other Topics Concern  . Not on file  Social History Narrative  . Not on file   Social Determinants of Health   Financial Resource Strain: Not on file  Food Insecurity: Not on file  Transportation Needs: Not on file  Physical Activity: Not on file  Stress: Not on file  Social Connections: Not on file  Intimate Partner Violence: Not on file    Family History  Problem Relation Age of Onset  . Cancer Mother        breast, skin  . Cancer Father        Pancreatic   . Hypertension Father   . Breast cancer Maternal Grandmother     The following portions of the patient's history were reviewed and updated as appropriate: allergies, current medications, past family history, past medical history, past social history, past surgical history and  problem list.  Review of Systems Pertinent items are noted in HPI.   Objective:  BP 107/65   Pulse 80   Ht 6\' 1"  (1.854 m)   Wt 186 lb (84.4 kg)   LMP 02/11/2020   BMI 24.54 kg/m  CONSTITUTIONAL: Well-developed, well-nourished female in no acute distress.  HENT:  Normocephalic, atraumatic, External right and left ear normal. Oropharynx is clear and moist EYES: Conjunctivae and EOM are normal. Pupils are equal, round, and reactive to light. No scleral icterus.  NECK: Normal range of motion, supple, no masses.  Normal thyroid.  SKIN: Skin is warm and dry. No rash noted. Not diaphoretic. No erythema. No pallor. NEUROLOGIC: Alert and oriented to person, place, and time. Normal reflexes, muscle tone coordination. No cranial nerve deficit noted. PSYCHIATRIC:  Normal mood and affect. Normal behavior. Normal judgment and thought content. CARDIOVASCULAR: Normal heart rate noted RESPIRATORY: Effort normal, no problems with respiration noted. BREASTS: Symmetric in size. No masses, skin changes, nipple drainage, or lymphadenopathy. ABDOMEN: Soft, no distention noted.  No tenderness, rebound or guarding.  PELVIC: Normal appearing external genitalia; normal appearing vaginal mucosa and cervix.  No abnormal discharge noted.  Pap smear obtained. Pelvic cultures obtained. Normal uterine size, no other palpable masses, no uterine or adnexal tenderness. MUSCULOSKELETAL: Normal range of motion. No tenderness.  No cyanosis, clubbing, or edema.   Exam done with chaperone present.  Assessment and Plan:   1. Well woman exam Healthy female exam - Cytology - PAP( [place]) - Cervicovaginal ancillary only( [place])  2. Routine screening for STI (sexually transmitted infection) GC/CT/trich  3. Vaginal discharge Swab today May need BV suppression if continues, check test today to rule out other infection  4. Cervical cancer screening Pap today  5. Family history of breast cancer Mom with lumpectomy and radiation at 38 & 73 Grandmother dx age 15 & 69 - MM 3D SCREEN BREAST BILATERAL; Future   Will follow up results of pap smear/STI screen and manage accordingly. Encouraged improvement in diet and exercise.  Accepts STI screen. COVID vaccine UTD Mammogram ordered today Referral for colonoscopy n/a Flu vaccine n/a  Routine preventative health maintenance measures emphasized. Please refer to After Visit Summary for other counseling recommendations.    Feliz Beam, M.D. Attending Center for [employer] Fish farm manager)

## 2020-02-18 NOTE — Progress Notes (Signed)
Last pap- 08/06/13- negative

## 2020-02-22 LAB — CERVICOVAGINAL ANCILLARY ONLY
Bacterial Vaginitis (gardnerella): NEGATIVE
Candida Glabrata: NEGATIVE
Candida Vaginitis: NEGATIVE
Chlamydia: NEGATIVE
Comment: NEGATIVE
Comment: NEGATIVE
Comment: NEGATIVE
Comment: NEGATIVE
Comment: NEGATIVE
Comment: NORMAL
Neisseria Gonorrhea: NEGATIVE
Trichomonas: NEGATIVE

## 2020-02-25 LAB — CYTOLOGY - PAP
Comment: NEGATIVE
Diagnosis: NEGATIVE
Diagnosis: REACTIVE
High risk HPV: NEGATIVE

## 2020-04-03 IMAGING — DX DG LUMBAR SPINE BEND(FLEX/EXT) ONLY 2-3 V
2 series · 3 of 3 positions shown · non-contrast
Comparison: Lumbar spine radiograph dated 03/16/2015.

CLINICAL DATA: 33-year-old female with low back pain x3 days.

EXAM:
LUMBAR SPINE FLEX AND EXTEND ONLY - 2-3 VIEW

[l-spine lat]
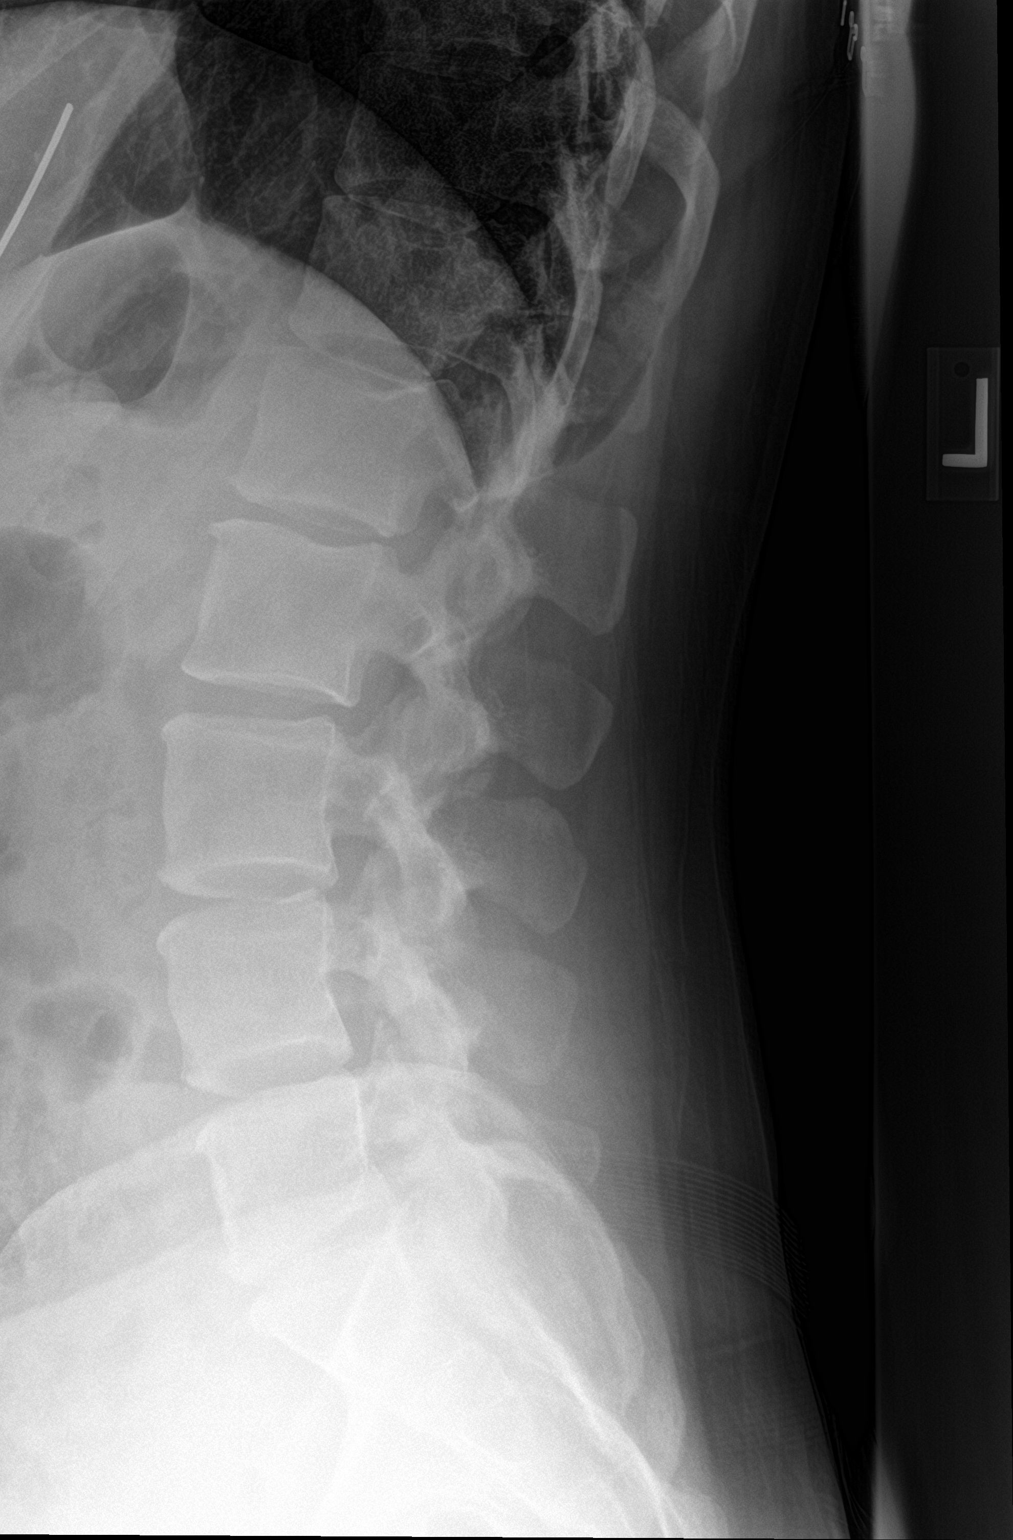

[Series 2: l-spine flex · 0.14mm/px · 2 of 2 slices shown]
[im 1/2]
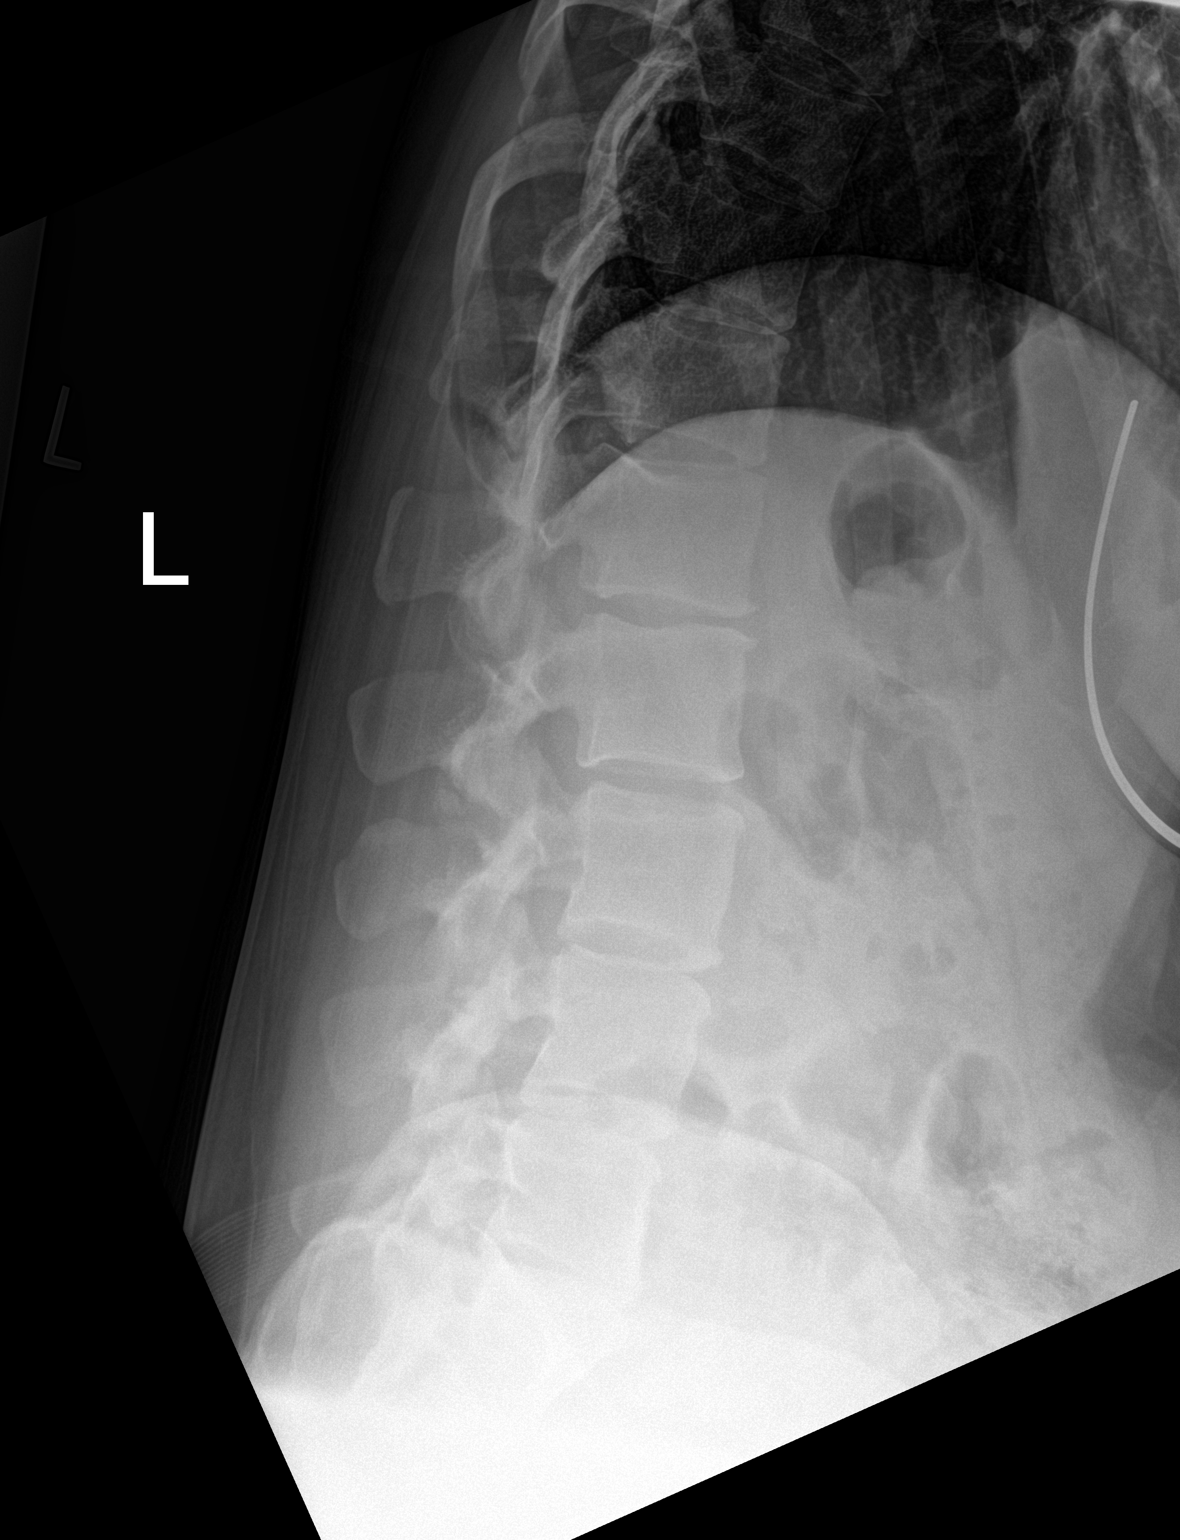
[im 2/2]
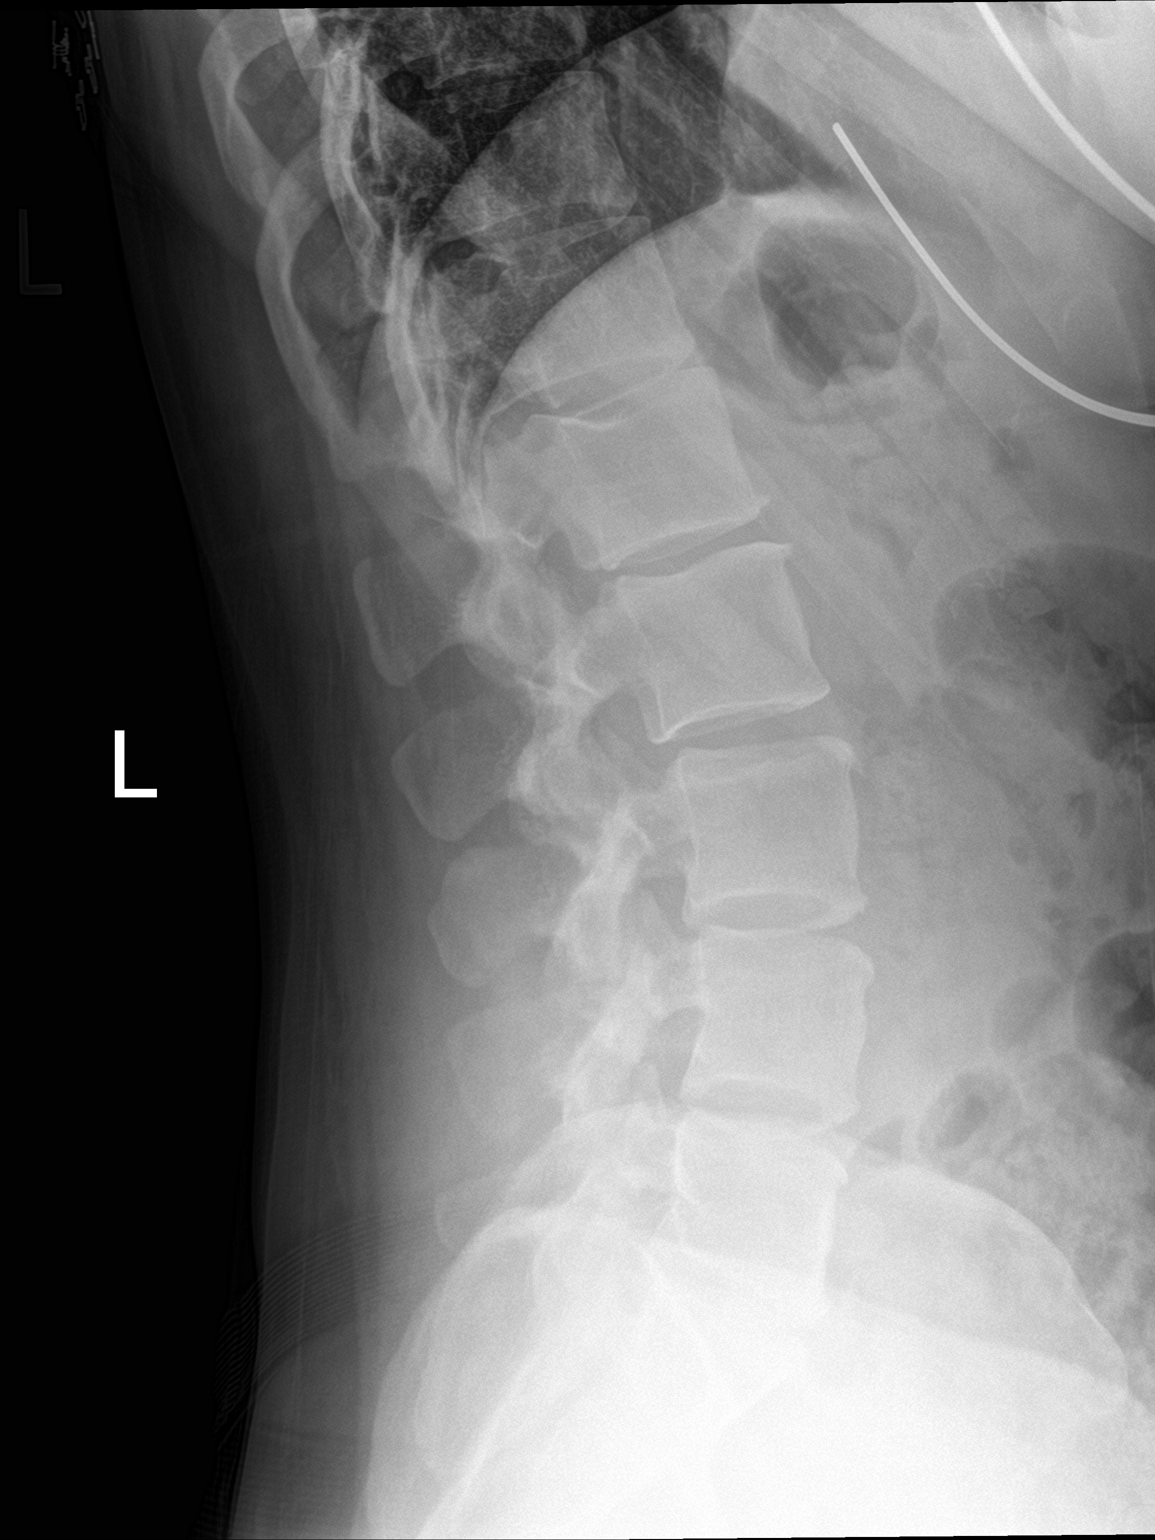

[3 of 3 positions shown; findings below may reference images not displayed]

FINDINGS: Evaluation is limited as a frontal view is not provided.

There is no acute fracture or subluxation. The vertebral body
heights and disc spaces are maintained. The visualized posterior
elements appear intact. The visualized neural foramina appear
patent. Minimal degenerative changes and spurring. The soft tissues
are unremarkable.
IMPRESSION: No acute findings.

## 2020-04-13 ENCOUNTER — Ambulatory Visit (INDEPENDENT_AMBULATORY_CARE_PROVIDER_SITE_OTHER): Payer: No Typology Code available for payment source

## 2020-04-13 ENCOUNTER — Other Ambulatory Visit: Payer: Self-pay

## 2020-04-13 DIAGNOSIS — Z1231 Encounter for screening mammogram for malignant neoplasm of breast: Secondary | ICD-10-CM

## 2020-04-13 DIAGNOSIS — Z803 Family history of malignant neoplasm of breast: Secondary | ICD-10-CM

## 2020-08-24 ENCOUNTER — Telehealth: Payer: Self-pay | Admitting: Sports Medicine

## 2020-08-24 DIAGNOSIS — M25562 Pain in left knee: Secondary | ICD-10-CM | POA: Insufficient documentation

## 2020-08-24 NOTE — Telephone Encounter (Signed)
Left knee pain This is a pleasant 35 year old female, after recent beach trip she has noted increasing pain under her left patella, with mechanical symptoms, popping, catching. On exam she does have a palpable parapatellar plica, there is no patellar crepitus, I think her principal issue is the plica, though patellofemoral chondromalacia is in the differential. We will get some x-rays and she will do some patellofemoral rehab, otherwise we can watch this for now, certainly we could inject the parapatellar plica if symptoms become intractable.

## 2020-08-24 NOTE — Assessment & Plan Note (Signed)
This is a pleasant 35 year old female, after recent beach trip she has noted increasing pain under her left patella, with mechanical symptoms, popping, catching. On exam she does have a palpable parapatellar plica, there is no patellar crepitus, I think her principal issue is the plica, though patellofemoral chondromalacia is in the differential. We will get some x-rays and she will do some patellofemoral rehab, otherwise we can watch this for now, certainly we could inject the parapatellar plica if symptoms become intractable.

## 2020-09-13 ENCOUNTER — Other Ambulatory Visit (HOSPITAL_BASED_OUTPATIENT_CLINIC_OR_DEPARTMENT_OTHER): Payer: Self-pay | Admitting: Nurse Practitioner

## 2020-09-13 DIAGNOSIS — L03039 Cellulitis of unspecified toe: Secondary | ICD-10-CM

## 2020-09-13 MED ORDER — CHLORHEXIDINE GLUCONATE 4 % EX LIQD: CUTANEOUS | 0 refills | Status: DC

## 2020-09-13 MED ORDER — HYDROCODONE-ACETAMINOPHEN 5-325 MG PO TABS: 1.0000 | ORAL_TABLET | Freq: Four times a day (QID) | ORAL | 0 refills | Status: AC | PRN

## 2020-09-13 MED ORDER — DOXYCYCLINE HYCLATE 100 MG PO TABS: 100.0000 mg | ORAL_TABLET | Freq: Two times a day (BID) | ORAL | 0 refills | Status: DC

## 2020-10-27 ENCOUNTER — Ambulatory Visit (INDEPENDENT_AMBULATORY_CARE_PROVIDER_SITE_OTHER): Payer: No Typology Code available for payment source | Admitting: Family Medicine

## 2020-10-27 DIAGNOSIS — Z7184 Encounter for health counseling related to travel: Secondary | ICD-10-CM

## 2020-10-27 DIAGNOSIS — M5136 Other intervertebral disc degeneration, lumbar region: Secondary | ICD-10-CM

## 2020-10-27 MED ORDER — AZITHROMYCIN 250 MG PO TABS: ORAL_TABLET | ORAL | 0 refills | Status: AC

## 2020-10-27 MED ORDER — DULOXETINE HCL 30 MG PO CPEP: 30.0000 mg | ORAL_CAPSULE | Freq: Every day | ORAL | 1 refills | Status: DC

## 2020-10-27 MED ORDER — PREDNISONE 20 MG PO TABS: 40.0000 mg | ORAL_TABLET | Freq: Every day | ORAL | 0 refills | Status: DC

## 2020-10-27 MED ORDER — FLUCONAZOLE 150 MG PO TABS: 150.0000 mg | ORAL_TABLET | Freq: Once | ORAL | 0 refills | Status: AC

## 2020-10-27 NOTE — Assessment & Plan Note (Signed)
Pain is now more in the lower thoracic area though she still has occasional pain in the sacral area as well.  For the short-term we will treat with a burst of prednisone.  Make sure to take with food and water avoid if any GI upset.  Will hold NSAIDs during use of prednisone.  She has had prednisone once before and it was helpful hopefully this will her a little bit back to her baseline since her coughing has really exacerbated her symptoms.  We did discuss in the long-term considering a trial of Cymbalta or gabapentin or Lyrica.  She will think about it and let me know which she would like to do.  Certainly think 1 of these would be a reasonable option and could always be stopped if not tolerated well or if not improving chronic discomfort.

## 2020-10-27 NOTE — Progress Notes (Signed)
Acute Office Visit  Subjective:    Patient ID: Kimberly Knight, female    DOB: 08-Aug-1985, 35 y.o.   MRN: [id number]  Chief Complaint  Patient presents with   Cough    HPI Patient is in today for exacerbation of chronic back pain.  She has a history of anterior lumbar fusion of L5-S1 back in 2021.  But she has been having pain above that level in the lower thoracic spine it has been more chronic she has been trying to manage it with NSAIDs and staying active.  She is had to limit a lot of activities because of pain but has been able to swim and sometimes walk and do other activities for exercise.  It is just gotten very frustrating because some days are better than others and she does not know what should be able to do 1 day versus the next.  The pain can be quite severe.  She had an upper respiratory illness that started about 3 weeks ago which caused a cough and unfortunately the cough has really exacerbated her back symptoms.  The cough has been gradually getting some better she has been using lozenges and keeping the throat moist and that does seem to help tremendously.  Is actually traveling to [place] in about a week and would like a prescription for possible yeast infection and traveler's diarrhea.  Past Medical History:  Diagnosis Date   Disc degeneration, lumbosacral    Low blood pressure    when sick, body under stress   Radicular pain     Past Surgical History:  Procedure Laterality Date   ABDOMINAL EXPOSURE N/A 06/22/2019   Procedure: ABDOMINAL EXPOSURE;  Surgeon: Rosetta Posner, MD;  Location: [facility];  Service: Vascular;  Laterality: N/A;   ANTERIOR LUMBAR FUSION N/A 06/22/2019   Procedure: LUMBAR FIVE-SACRAL ONE ANTERIOR LUMBAR INTERBODY FUSION;  Surgeon: Eustace Moore, MD;  Location: [facility];  Service: Neurosurgery;  Laterality: N/A;   hemangioma removal 1995     TONSILLECTOMY     child    Family History  Problem Relation Age of Onset   Cancer Mother         breast, skin   Breast cancer Mother 33       57   Cancer Father        Pancreatic    Hypertension Father    Breast cancer Maternal Grandmother     Social History   Socioeconomic History   Marital status: Married    Spouse name: Jayce   Number of children: 3   Years of education: [place]   Highest education level: Professional school degree (e.g., MD, DDS, DVM, JD)  Occupational History   Not on file  Tobacco Use   Smoking status: Never   Smokeless tobacco: Never  Vaping Use   Vaping Use: Never used  Substance and Sexual Activity   Alcohol use: Yes    Comment: social   Drug use: No   Sexual activity: Yes    Partners: Male  Other Topics Concern   Not on file  Social History Narrative   Not on file   Social Determinants of Health   Financial Resource Strain: Not on file  Food Insecurity: Not on file  Transportation Needs: Not on file  Physical Activity: Not on file  Stress: Not on file  Social Connections: Not on file  Intimate Partner Violence: Not on file    Outpatient Medications Prior to Visit  Medication Sig Dispense  Refill   chlorhexidine (HIBICLENS) 4 % external liquid Add to warm water and soak foot daily. 236 mL 0   doxycycline (VIBRA-TABS) 100 MG tablet Take 1 tablet (100 mg total) by mouth 2 (two) times daily. 10 tablet 0   No facility-administered medications prior to visit.    No Known Allergies  Review of Systems     Objective:    Physical Exam Vitals reviewed.  Constitutional:      Appearance: She is well-developed.  HENT:     Head: Normocephalic and atraumatic.  Eyes:     Conjunctiva/sclera: Conjunctivae normal.  Cardiovascular:     Rate and Rhythm: Normal rate.  Pulmonary:     Effort: Pulmonary effort is normal.  Skin:    General: Skin is dry.     Coloration: Skin is not pale.  Neurological:     Mental Status: She is alert and oriented to person, place, and time.  Psychiatric:        Behavior: Behavior normal.    There were no  vitals taken for this visit. Wt Readings from Last 3 Encounters:  02/18/20 186 lb (84.4 kg)  06/22/19 184 lb (83.5 kg)  06/18/19 184 lb 14.4 oz (83.9 kg)    Health Maintenance Due  Topic Date Due   Pneumococcal Vaccine 34-60 Years old (1 - PCV) Never done   Hepatitis C Screening  Never done   INFLUENZA VACCINE  09/26/2020    There are no preventive care reminders to display for this patient.   Lab Results  Component Value Date   TSH 2.49 06/16/2018   Lab Results  Component Value Date   WBC 5.8 06/18/2019   HGB 12.9 06/18/2019   HCT 38.8 06/18/2019   MCV 90.4 06/18/2019   PLT 265 06/18/2019   Lab Results  Component Value Date   NA 139 06/18/2019   K 3.7 06/18/2019   CO2 27 06/18/2019   GLUCOSE 90 06/18/2019   BUN 11 06/18/2019   CREATININE 0.84 06/18/2019   BILITOT 0.9 06/16/2018   ALKPHOS 50 07/10/2012   AST 23 06/16/2018   ALT 34 (H) 06/16/2018   PROT 7.1 06/16/2018   ALBUMIN 4.8 07/10/2012   CALCIUM 9.4 06/18/2019   ANIONGAP 8 06/18/2019   Lab Results  Component Value Date   CHOL 181 06/16/2018   Lab Results  Component Value Date   HDL 50 06/16/2018   Lab Results  Component Value Date   LDLCALC 109 (H) 06/16/2018   Lab Results  Component Value Date   TRIG 109 06/16/2018   Lab Results  Component Value Date   CHOLHDL 3.6 06/16/2018   Lab Results  Component Value Date   HGBA1C 5.0 06/16/2018       Assessment & Plan:   Problem List Items Addressed This Visit       Other   Discogenic low back pain - Primary    Pain is now more in the lower thoracic area though she still has occasional pain in the sacral area as well.  For the short-term we will treat with a burst of prednisone.  Make sure to take with food and water avoid if any GI upset.  Will hold NSAIDs during use of prednisone.  She has had prednisone once before and it was helpful hopefully this will her a little bit back to her baseline since her coughing has really exacerbated her  symptoms.  We did discuss in the long-term considering a trial of Cymbalta or gabapentin or  Lyrica.  She will think about it and let me know which she would like to do.  Certainly think 1 of these would be a reasonable option and could always be stopped if not tolerated well or if not improving chronic discomfort.      Relevant Medications   predniSONE (DELTASONE) 20 MG tablet   DULoxetine (CYMBALTA) 30 MG capsule   Other Visit Diagnoses     Travel advice encounter       Relevant Medications   azithromycin (ZITHROMAX) 250 MG tablet   fluconazole (DIFLUCAN) 150 MG tablet      Travel advice-sent prescription for azithromycin for traveler's diarrhea can also be used for sinusitis etc.  Also present a prescription for Diflucan as she will be in the water a lot and has a tendency towards yeast infections.  Meds ordered this encounter  Medications   predniSONE (DELTASONE) 20 MG tablet    Sig: Take 2 tablets (40 mg total) by mouth daily with breakfast.    Dispense:  10 tablet    Refill:  0   azithromycin (ZITHROMAX) 250 MG tablet    Sig: 2 Ttabs PO on Day 1, then one a day x 4 days.    Dispense:  6 tablet    Refill:  0   fluconazole (DIFLUCAN) 150 MG tablet    Sig: Take 1 tablet (150 mg total) by mouth once for 1 dose.    Dispense:  3 tablet    Refill:  0   DULoxetine (CYMBALTA) 30 MG capsule    Sig: Take 1 capsule (30 mg total) by mouth daily.    Dispense:  30 capsule    Refill:  1     Beatrice Lecher, MD

## 2020-11-04 ENCOUNTER — Telehealth: Payer: Self-pay | Admitting: Family Medicine

## 2020-11-04 DIAGNOSIS — M546 Pain in thoracic spine: Secondary | ICD-10-CM

## 2020-11-04 DIAGNOSIS — G8929 Other chronic pain: Secondary | ICD-10-CM

## 2020-11-04 DIAGNOSIS — M5136 Other intervertebral disc degeneration, lumbar region: Secondary | ICD-10-CM

## 2020-11-04 NOTE — Telephone Encounter (Signed)
Patient called and communicated that she is actually feeling worse in regards to her mid and low back pain.  She has been taking the prednisone and even a pain reliever without significant improvement also using icy hot patches.  It is to the point where she feels weak in her legs and is barely able to walk.  We will go ahead and place order for MRI and try to get her scheduled this weekend.

## 2020-11-07 ENCOUNTER — Other Ambulatory Visit: Payer: Self-pay

## 2020-11-07 ENCOUNTER — Telehealth: Payer: Self-pay | Admitting: Family Medicine

## 2020-11-07 ENCOUNTER — Other Ambulatory Visit: Payer: No Typology Code available for payment source

## 2020-11-07 ENCOUNTER — Ambulatory Visit (INDEPENDENT_AMBULATORY_CARE_PROVIDER_SITE_OTHER): Payer: No Typology Code available for payment source

## 2020-11-07 DIAGNOSIS — G8929 Other chronic pain: Secondary | ICD-10-CM

## 2020-11-07 DIAGNOSIS — M5136 Other intervertebral disc degeneration, lumbar region: Secondary | ICD-10-CM

## 2020-11-07 DIAGNOSIS — M5410 Radiculopathy, site unspecified: Secondary | ICD-10-CM

## 2020-11-07 DIAGNOSIS — M546 Pain in thoracic spine: Secondary | ICD-10-CM | POA: Diagnosis not present

## 2020-11-07 MED ORDER — GADOBUTROL 1 MMOL/ML IV SOLN
7.5000 mL | Freq: Once | INTRAVENOUS | Status: AC | PRN
  Administered 2020-11-07: 7.5 mL via INTRAVENOUS

## 2020-11-07 NOTE — Progress Notes (Signed)
Ordered MRI Lumbar spine with and without contrast.

## 2020-11-07 NOTE — Telephone Encounter (Signed)
Spoke with patient.  Because of prior history of lumbar surgery recommend with contrast to better differentiate between just scar tissue present and new findings.  Will change to the MRI of lumbar spine to with contrast and continue with and without contrast for the thoracic spine.

## 2020-11-08 NOTE — Progress Notes (Signed)
Reviewed results with patient. 

## 2020-11-09 ENCOUNTER — Ambulatory Visit (INDEPENDENT_AMBULATORY_CARE_PROVIDER_SITE_OTHER): Payer: No Typology Code available for payment source | Admitting: Sports Medicine

## 2020-11-09 DIAGNOSIS — M5136 Other intervertebral disc degeneration, lumbar region with discogenic back pain only: Secondary | ICD-10-CM

## 2020-11-09 MED ORDER — TRAMADOL HCL 50 MG PO TABS: 50.0000 mg | ORAL_TABLET | Freq: Three times a day (TID) | ORAL | 0 refills | Status: DC | PRN

## 2020-11-09 NOTE — Progress Notes (Signed)
    Procedures performed today:    None.  Independent interpretation of notes and tests performed by another provider:   I did personally review her lumbar and thoracic spine MRIs, the operative level looks great, well decompressed, she does have mild L1-L2 disc desiccation, very mild disc protrusion at L4-L5, not significantly changed from the prior MRI, there is some left L4-L5 facet arthritis.  Her thoracic spine looks pretty good, she does have a fair amount of ligamentum flavum hypertrophy on the right approximately at the T10-T11 level.  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  More recently she is having increasing pain in a couple of areas, 1 in the lower thoracic spine and also in the mid lumbar spine, pain is predominantly axial and discogenic, worse with sitting, flexion Valsalva, and worsened after a trip out of the country and after doing some significant flexion exercises and Pilates.  She also had a cold with predominantly tussive symptoms which hurt her back more.  She does endorse some discomfort right anterior thigh.  She saw her PCP and got prednisone which has helped dramatically, she also was given Cymbalta which I think is a fantastic idea for neuromodulation of her pain.   I did personally review her lumbar and thoracic spine MRIs, the operative level looks great, well decompressed, she does have mild L1-L2 disc desiccation, very mild disc protrusion at L4-L5, not significantly changed from the prior MRI, there is some left L4-L5 facet arthritis.  Her thoracic spine looks pretty good, she does have a fair amount of ligamentum flavum hypertrophy on the right approximately at the T10-T11 level.  On exam she does have some tenderness to palpation in the lower lumbar spine thought more to be muscular as the pain is  reproducible with palpation.  I think her current episode is likely to be more temporary considering the preceding tussive episodes as well as activities with Pilates and her trip.  I have encouraged her to continue the Cymbalta, we should hold off on Pilates for now, continue aquatic therapy, and I am going to add some aggressive formal physical therapy here in our facility with traction included.  Follow-up with me in 4 to 6 weeks, we can reevaluate mood symptomatology and perception of pain with Cymbalta and consider dose titration if needed, if she is having predominantly axial pain in the lower lumbar spine that is reproducible with palpation we would certainly consider a trigger point injection.  I am absolutely happy to refill her tramadol.    ___________________________________________ Gwen Her. Dianah Field, M.D., ABFM., CAQSM. Primary Care and [place] Instructor of [place] [employer] [facility] [employer]

## 2020-11-09 NOTE — Assessment & Plan Note (Addendum)
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  More recently she is having increasing pain in a couple of areas, 1 in the lower thoracic spine and also in the mid lumbar spine, pain is predominantly axial and discogenic, worse with sitting, flexion Valsalva, and worsened after a trip out of the country and after doing some significant flexion exercises and Pilates.  She also had a cold with predominantly tussive symptoms which hurt her back more.  She does endorse some discomfort right anterior thigh.  She saw her PCP and got prednisone which has helped dramatically, she also was given Cymbalta which I think is a fantastic idea for neuromodulation of her pain.  I did personally review her lumbar and thoracic spine MRIs, the operative level looks great, well decompressed, she does have mild L1-L2 disc desiccation, very mild disc protrusion at L4-L5, not significantly changed from the prior MRI, there is some left L4-L5 facet arthritis.  Her thoracic spine looks pretty good, she does have a fair amount of ligamentum flavum hypertrophy on the right approximately at the T10-T11 level.  On exam she does have some tenderness to palpation in the lower lumbar spine thought more to be muscular as the pain is reproducible with palpation.  I think her current episode is likely to be more temporary considering the preceding tussive episodes as well as activities with Pilates and her trip.  I have encouraged her to continue the Cymbalta, we should hold off on Pilates for now, continue aquatic therapy, and I am going to add some aggressive formal physical therapy here in our facility with traction included.  Follow-up with me in 4 to 6 weeks, we can reevaluate mood symptomatology and perception of pain with Cymbalta and consider dose titration if needed, if she is having predominantly axial pain in the lower lumbar spine that is reproducible with  palpation we would certainly consider a trigger point injection.  I am absolutely happy to refill her tramadol.

## 2020-11-22 ENCOUNTER — Other Ambulatory Visit: Payer: Self-pay | Admitting: Family Medicine

## 2020-11-22 ENCOUNTER — Other Ambulatory Visit (HOSPITAL_BASED_OUTPATIENT_CLINIC_OR_DEPARTMENT_OTHER): Payer: Self-pay

## 2020-11-22 DIAGNOSIS — M5136 Other intervertebral disc degeneration, lumbar region: Secondary | ICD-10-CM

## 2020-11-22 MED ORDER — DULOXETINE HCL 30 MG PO CPEP
30.0000 mg | ORAL_CAPSULE | Freq: Every day | ORAL | 1 refills | Status: DC
  Filled 2020-11-22: qty 90, 90d supply, fill #0
  Filled 2021-03-01: qty 90, 90d supply, fill #1

## 2020-11-22 NOTE — Progress Notes (Signed)
Meds ordered this encounter  Medications   DULoxetine (CYMBALTA) 30 MG capsule    Sig: Take 1 capsule (30 mg total) by mouth daily.    Dispense:  90 capsule    Refill:  1

## 2020-11-23 ENCOUNTER — Other Ambulatory Visit (HOSPITAL_BASED_OUTPATIENT_CLINIC_OR_DEPARTMENT_OTHER): Payer: Self-pay

## 2021-03-01 ENCOUNTER — Other Ambulatory Visit (HOSPITAL_BASED_OUTPATIENT_CLINIC_OR_DEPARTMENT_OTHER): Payer: Self-pay

## 2021-04-10 ENCOUNTER — Telehealth: Payer: Self-pay | Admitting: Family Medicine

## 2021-04-10 MED ORDER — AMOXICILLIN-POT CLAVULANATE 875-125 MG PO TABS: 1.0000 | ORAL_TABLET | Freq: Two times a day (BID) | ORAL | 0 refills | Status: DC

## 2021-04-10 NOTE — Telephone Encounter (Signed)
Puppy bit her ankle this weekend and has a laceration horizontally across the Achilles tendon.  She has normal range of motion just some tenderness and some mild erythema.  No drainage or pus.  She is also had significant nasal congestion after a cold for about a week and a half.  She is going to apply more occlusive bandage.  And will send over prescription for Augmentin to the pharmacy.  Meds ordered this encounter  Medications   amoxicillin-clavulanate (AUGMENTIN) 875-125 MG tablet    Sig: Take 1 tablet by mouth 2 (two) times daily.    Dispense:  14 tablet    Refill:  0

## 2021-05-12 ENCOUNTER — Other Ambulatory Visit (HOSPITAL_BASED_OUTPATIENT_CLINIC_OR_DEPARTMENT_OTHER): Payer: Self-pay

## 2021-05-12 ENCOUNTER — Telehealth: Payer: Self-pay | Admitting: Family Medicine

## 2021-05-12 DIAGNOSIS — J020 Streptococcal pharyngitis: Secondary | ICD-10-CM

## 2021-05-12 MED ORDER — PENICILLIN V POTASSIUM 500 MG PO TABS
500.0000 mg | ORAL_TABLET | Freq: Two times a day (BID) | ORAL | 0 refills | Status: AC
  Filled 2021-05-12: qty 20, 10d supply, fill #0

## 2021-05-12 NOTE — Telephone Encounter (Signed)
Son diagnosed with strep throat this morning daughter diagnosed with strep throat last week.  Today started with sore throat and noticed exudate posteriorly bilaterally.  No fever or chills.  She does have some mild cervical anterior lymphadenopathy lymphadenopathy.  Prescription sent in for Pen-Vee K.  Call if not better in 1 week. ?

## 2021-06-14 ENCOUNTER — Other Ambulatory Visit: Payer: Self-pay | Admitting: Neurology

## 2021-06-14 ENCOUNTER — Other Ambulatory Visit (HOSPITAL_BASED_OUTPATIENT_CLINIC_OR_DEPARTMENT_OTHER): Payer: Self-pay

## 2021-06-14 DIAGNOSIS — M5136 Other intervertebral disc degeneration, lumbar region: Secondary | ICD-10-CM

## 2021-06-14 MED ORDER — DULOXETINE HCL 30 MG PO CPEP
30.0000 mg | ORAL_CAPSULE | Freq: Every day | ORAL | 1 refills | Status: DC
  Filled 2021-06-14: qty 90, 90d supply, fill #0

## 2021-09-15 ENCOUNTER — Other Ambulatory Visit: Payer: Self-pay | Admitting: Neurology

## 2021-09-15 MED ORDER — SCOPOLAMINE 1 MG/3DAYS TD PT72: 1.0000 | MEDICATED_PATCH | TRANSDERMAL | 0 refills | Status: DC

## 2021-09-15 MED ORDER — FLUCONAZOLE 150 MG PO TABS: 150.0000 mg | ORAL_TABLET | Freq: Once | ORAL | 0 refills | Status: AC

## 2021-10-17 ENCOUNTER — Ambulatory Visit (INDEPENDENT_AMBULATORY_CARE_PROVIDER_SITE_OTHER): Payer: No Typology Code available for payment source | Admitting: Family Medicine

## 2021-10-17 ENCOUNTER — Encounter: Payer: Self-pay | Admitting: Family Medicine

## 2021-10-17 ENCOUNTER — Other Ambulatory Visit (HOSPITAL_BASED_OUTPATIENT_CLINIC_OR_DEPARTMENT_OTHER): Payer: Self-pay

## 2021-10-17 VITALS — BP 129/70 | HR 93 | Ht 73.5 in | Wt 206.0 lb

## 2021-10-17 DIAGNOSIS — Z0000 Encounter for general adult medical examination without abnormal findings: Secondary | ICD-10-CM

## 2021-10-17 DIAGNOSIS — M5136 Other intervertebral disc degeneration, lumbar region: Secondary | ICD-10-CM

## 2021-10-17 MED ORDER — DULOXETINE HCL 30 MG PO CPEP
30.0000 mg | ORAL_CAPSULE | Freq: Every day | ORAL | 1 refills | Status: DC
  Filled 2021-10-17: qty 90, 90d supply, fill #0

## 2021-10-17 NOTE — Progress Notes (Signed)
Complete physical exam  Patient: Kimberly Knight   DOB: May 05, 1985   36 y.o. Female  MRN: [id number]  Subjective:    Chief Complaint  Patient presents with   Annual Exam    Kimberly Knight is a 36 y.o. female who presents today for a complete physical exam. She reports consuming a general diet.  Works out 3-4 days per week, swimming, volleyball  She generally feels well. She reports sleeping well. She does not have additional problems to discuss today.   She has MS no back pain.  Is taking the Cymbalta daily.  She said she did try to come off a couple of times but felt irritable.  Overall the Cymbalta has been helpful but does feel like it has contributed to weight gain. Still eating healthy and exercising regularly.    Most recent fall risk assessment:    10/17/2021    2:04 PM  [place] in the past year? 0  Number falls in past yr: 0  Injury with Fall? 0  Risk for fall due to : No Fall Risks  Follow up Falls evaluation completed     Most recent depression screenings:    10/17/2021    2:04 PM 10/22/2018   12:09 PM  PHQ 2/9 Scores  PHQ - 2 Score 0 0        Patient Care Team: Hali Marry, MD as PCP - General (Family Medicine) Silverio Decamp, MD as Referring Physician (Neurosurgery) Eustace Moore, MD as Consulting Physician (Neurosurgery)   Outpatient Medications Prior to Visit  Medication Sig   [DISCONTINUED] DULoxetine (CYMBALTA) 30 MG capsule Take 1 capsule (30 mg total) by mouth daily.   [DISCONTINUED] scopolamine (TRANSDERM-SCOP) 1 MG/3DAYS Place 1 patch (1.5 mg total) onto the skin every 3 (three) days.   [DISCONTINUED] traMADol (ULTRAM) 50 MG tablet Take 1-2 tablets (50-100 mg total) by mouth every 8 (eight) hours as needed for moderate pain. Maximum 6 tabs per day.   No facility-administered medications prior to visit.    ROS        Objective:     BP 129/70   Pulse 93   Ht 6' 1.5" (1.867 m)   Wt 206 lb (93.4 kg)    SpO2 98%   BMI 26.81 kg/m    Physical Exam Vitals and nursing note reviewed.  Constitutional:      Appearance: She is well-developed.  HENT:     Head: Normocephalic and atraumatic.  Cardiovascular:     Rate and Rhythm: Normal rate and regular rhythm.     Heart sounds: Normal heart sounds.  Pulmonary:     Effort: Pulmonary effort is normal.     Breath sounds: Normal breath sounds.  Skin:    General: Skin is warm and dry.  Neurological:     Mental Status: She is alert and oriented to person, place, and time.  Psychiatric:        Behavior: Behavior normal.      No results found for any visits on 10/17/21.     Assessment & Plan:    Routine Health Maintenance and Physical Exam  Immunization History  Administered Date(s) Administered   Influenza Whole 11/26/2012   Influenza,inj,Quad PF,6+ Mos 12/21/2019, 11/26/2020   Influenza-Unspecified 11/27/2014, 11/15/2016, 11/28/2017, 11/11/2018   Tdap 02/26/2010, 05/20/2014    Health Maintenance  Topic Date Due   Hepatitis C Screening  Never done   INFLUENZA VACCINE  09/26/2021   TETANUS/TDAP  05/19/2024  PAP SMEAR-Modifier  02/17/2025   HIV Screening  Completed   Pneumococcal Vaccine 30-46 Years old  Aged Out   HPV VACCINES  Aged Out    Discussed health benefits of physical activity, and encouraged her to engage in regular exercise appropriate for her age and condition.  Problem List Items Addressed This Visit       Musculoskeletal and Integument   Discogenic low back pain   Relevant Medications   DULoxetine (CYMBALTA) 30 MG capsule   Other Visit Diagnoses     Wellness examination    -  Primary      Keep up a regular exercise program and make sure you are eating a healthy diet Try to eat 4 servings of dairy a day, or if you are lactose intolerant take a calcium with vitamin D daily.  Your vaccines are up to date.    Did discuss tapering the Cymbalta and taking it every other day for a couple of weeks instead  of stopping it abruptly to see if it helps with irritability from possible withdrawal.  She did recover request a refill today.  Sent to pharmacy.  Call if any problems or concerns.  Return in about 1 year (around 10/18/2022) for Wellness Exam.     Beatrice Lecher, MD

## 2021-10-23 ENCOUNTER — Ambulatory Visit (INDEPENDENT_AMBULATORY_CARE_PROVIDER_SITE_OTHER): Payer: No Typology Code available for payment source | Admitting: Family Medicine

## 2021-10-23 ENCOUNTER — Other Ambulatory Visit: Payer: Self-pay

## 2021-10-23 DIAGNOSIS — R3129 Other microscopic hematuria: Secondary | ICD-10-CM

## 2021-10-23 DIAGNOSIS — R300 Dysuria: Secondary | ICD-10-CM

## 2021-10-23 LAB — POCT URINALYSIS DIP (CLINITEK)
Bilirubin, UA: NEGATIVE
Glucose, UA: NEGATIVE mg/dL
Ketones, POC UA: NEGATIVE mg/dL
Leukocytes, UA: NEGATIVE
Nitrite, UA: NEGATIVE
POC PROTEIN,UA: NEGATIVE
Spec Grav, UA: >=1.03 — AB (ref 1.010–1.025)
Urobilinogen, UA: 0.2 E.U./dL
pH, UA: 5.5 (ref 5.0–8.0)

## 2021-10-23 MED ORDER — SULFAMETHOXAZOLE-TRIMETHOPRIM 800-160 MG PO TABS: 1.0000 | ORAL_TABLET | Freq: Two times a day (BID) | ORAL | 0 refills | Status: DC

## 2021-10-23 NOTE — Progress Notes (Unsigned)
po

## 2021-10-23 NOTE — Progress Notes (Signed)
   Acute Office Visit  Subjective:     Patient ID: Kimberly Knight, female    DOB: Jul 27, 1985, 36 y.o.   MRN: [id number]  Chief Complaint  Patient presents with   Dysuria    HPI Patient is in today for dysuria and pelvic pressure for a couple of days.  No fevers chills or sweats. No gross hematuria. No change in vaginal discharge.    ROS      Objective:    There were no vitals taken for this visit.   Physical Exam Vitals reviewed.  Constitutional:      Appearance: She is well-developed.  HENT:     Head: Normocephalic and atraumatic.  Eyes:     Conjunctiva/sclera: Conjunctivae normal.  Cardiovascular:     Rate and Rhythm: Normal rate.  Pulmonary:     Effort: Pulmonary effort is normal.  Skin:    General: Skin is dry.     Coloration: Skin is not pale.  Neurological:     Mental Status: She is alert and oriented to person, place, and time.  Psychiatric:        Behavior: Behavior normal.     Results for orders placed or performed in visit on 10/23/21  POCT URINALYSIS DIP (CLINITEK)  Result Value Ref Range   Color, UA yellow yellow   Clarity, UA clear clear   Glucose, UA negative negative mg/dL   Bilirubin, UA negative negative   Ketones, POC UA negative negative mg/dL   Spec Grav, UA >=1.030 (A) 1.010 - 1.025   Blood, UA moderate (A) negative   pH, UA 5.5 5.0 - 8.0   POC PROTEIN,UA negative negative, trace   Urobilinogen, UA 0.2 0.2 or 1.0 E.U./dL   Nitrite, UA Negative Negative   Leukocytes, UA Negative Negative        Assessment & Plan:   Problem List Items Addressed This Visit   None Visit Diagnoses     Dysuria    -  Primary   Relevant Orders   Urinalysis, microscopic only   Microscopic hematuria          Analysis just shows some microscopic hematuria.  Will send for microscopic review.  So sent for culture.  Wet prep also sent for further evaluation.  Meds ordered this encounter  Medications   sulfamethoxazole-trimethoprim (BACTRIM DS)  800-160 MG tablet    Sig: Take 1 tablet by mouth 2 (two) times daily.    Dispense:  6 tablet    Refill:  0    No follow-ups on file.  Beatrice Lecher, MD

## 2021-10-24 LAB — URINALYSIS, MICROSCOPIC ONLY
Bacteria, UA: NONE SEEN /HPF
Hyaline Cast: NONE SEEN /LPF

## 2021-10-24 LAB — WET PREP FOR TRICH, YEAST, CLUE
MICRO NUMBER:: 13845116
Specimen Quality: ADEQUATE

## 2021-10-24 NOTE — Progress Notes (Signed)
Wet prep is normal.

## 2021-10-25 LAB — URINE CULTURE
MICRO NUMBER:: 13845147
Result:: NO GROWTH
SPECIMEN QUALITY:: ADEQUATE

## 2021-10-25 NOTE — Progress Notes (Signed)
Let patient know urine culture is negative.

## 2022-01-22 ENCOUNTER — Telehealth: Payer: Self-pay | Admitting: Family Medicine

## 2022-01-22 MED ORDER — CLONAZEPAM 0.5 MG PO TABS: 0.5000 mg | ORAL_TABLET | Freq: Two times a day (BID) | ORAL | 0 refills | Status: DC | PRN

## 2022-01-22 NOTE — Telephone Encounter (Signed)
Jariana's mother passed away suddenly this afternoon. Sent over clonazepam to use PRN>   Meds ordered this encounter  Medications   clonazePAM (KLONOPIN) 0.5 MG tablet    Sig: Take 1 tablet (0.5 mg total) by mouth 2 (two) times daily as needed for anxiety.    Dispense:  20 tablet    Refill:  0

## 2022-03-16 ENCOUNTER — Telehealth: Payer: Self-pay | Admitting: Family Medicine

## 2022-03-16 MED ORDER — PREDNISONE 20 MG PO TABS: 40.0000 mg | ORAL_TABLET | Freq: Every day | ORAL | 0 refills | Status: DC

## 2022-03-16 NOTE — Telephone Encounter (Signed)
Has had viral illness for several weeks. Now very ST and laryngitis. Midly swollen LNs in neck.  Will send I prednisone. Neg for COVID, flu and strep. If not better in one week pleaes let us know.  Meds ordered this encounter  Medications   predniSONE (DELTASONE) 20 MG tablet    Sig: Take 2 tablets (40 mg total) by mouth daily with breakfast.    Dispense:  10 tablet    Refill:  0

## 2022-04-13 ENCOUNTER — Telehealth: Payer: Self-pay | Admitting: Sports Medicine

## 2022-04-13 DIAGNOSIS — M5136 Other intervertebral disc degeneration, lumbar region: Secondary | ICD-10-CM

## 2022-04-13 DIAGNOSIS — Z981 Arthrodesis status: Secondary | ICD-10-CM

## 2022-04-13 DIAGNOSIS — M51369 Other intervertebral disc degeneration, lumbar region without mention of lumbar back pain or lower extremity pain: Secondary | ICD-10-CM

## 2022-04-13 NOTE — Assessment & Plan Note (Signed)
Chronic discogenic back pain, myalgias, patient would benefit from daily use of Zynex interferential treatment.

## 2022-04-13 NOTE — Telephone Encounter (Signed)
    Procedures performed today:    None.  Independent interpretation of notes and tests performed by another provider:   None.  Brief History, Exam, Impression, and Recommendations:    Degeneration of lumbar intervertebral disc Chronic discogenic back pain, myalgias, patient would benefit from daily use of Zynex interferential treatment.  S/P lumbar fusion Chronic back pain, discogenic, myalgias, patient would benefit from daily use of Zynex interferential treatment.    ____________________________________________ Gwen Her. Dianah Field, M.D., ABFM., CAQSM., AME. Primary Care and Sports Medicine [facility]  Adjunct Professor of [place] [employer]  Risk manager

## 2022-04-13 NOTE — Assessment & Plan Note (Signed)
Chronic back pain, discogenic, myalgias, patient would benefit from daily use of Zynex interferential treatment.

## 2022-04-24 ENCOUNTER — Other Ambulatory Visit (HOSPITAL_BASED_OUTPATIENT_CLINIC_OR_DEPARTMENT_OTHER): Payer: Self-pay

## 2022-04-24 ENCOUNTER — Other Ambulatory Visit: Payer: Self-pay | Admitting: *Deleted

## 2022-04-24 DIAGNOSIS — M5136 Other intervertebral disc degeneration, lumbar region: Secondary | ICD-10-CM

## 2022-04-24 MED ORDER — DULOXETINE HCL 30 MG PO CPEP
30.0000 mg | ORAL_CAPSULE | Freq: Every day | ORAL | 1 refills | Status: DC
  Filled 2022-04-24: qty 90, 90d supply, fill #0
  Filled 2022-08-06: qty 90, 90d supply, fill #1

## 2022-05-02 ENCOUNTER — Telehealth: Payer: Self-pay | Admitting: Family Medicine

## 2022-05-02 DIAGNOSIS — S99911A Unspecified injury of right ankle, initial encounter: Secondary | ICD-10-CM

## 2022-05-02 DIAGNOSIS — M79671 Pain in right foot: Secondary | ICD-10-CM

## 2022-05-02 DIAGNOSIS — S93401A Sprain of unspecified ligament of right ankle, initial encounter: Secondary | ICD-10-CM

## 2022-05-02 NOTE — Telephone Encounter (Signed)
Kimberly Knight reports. Pain/tenderness at the proximal 4th metatarsal head. Accidental inversion of the foot while hiking about a week ago.  Pinpoint tenderness present right over that metatarsal head.  Nontender over the fifth metatarsal head.  No significant swelling laterally over the foot.  Ace wrap has been helpful for comfort.  More painful to walk on it.  Some pain with inversion of the foot.  Orders Placed This Encounter  Procedures   DG Foot Complete Right    Standing Status:   Future    Standing Expiration Date:   05/02/2023    Order Specific Question:   Reason for Exam (SYMPTOM  OR DIAGNOSIS REQUIRED)    Answer:   Pain/tenderness at the proximal 4th metatarsal head.  Accidental inversion of the foot while hiking about a week ago.    Order Specific Question:   Is patient pregnant?    Answer:   No    Order Specific Question:   Preferred imaging location?    Answer:   [facility]

## 2022-05-03 ENCOUNTER — Other Ambulatory Visit (HOSPITAL_BASED_OUTPATIENT_CLINIC_OR_DEPARTMENT_OTHER): Payer: Self-pay

## 2022-05-03 DIAGNOSIS — T148XXA Other injury of unspecified body region, initial encounter: Secondary | ICD-10-CM | POA: Diagnosis not present

## 2022-05-03 DIAGNOSIS — M79671 Pain in right foot: Secondary | ICD-10-CM | POA: Diagnosis not present

## 2022-05-10 ENCOUNTER — Encounter: Payer: Self-pay | Admitting: Professional

## 2022-05-10 ENCOUNTER — Ambulatory Visit (INDEPENDENT_AMBULATORY_CARE_PROVIDER_SITE_OTHER): Payer: 59 | Admitting: Professional

## 2022-05-10 DIAGNOSIS — F4320 Adjustment disorder, unspecified: Secondary | ICD-10-CM

## 2022-05-10 NOTE — Progress Notes (Signed)
[facility] Counselor Initial Adult Exam  Name: Kimberly Knight Date: 05/16/2022 MRN: [id number] DOB: May 01, 1985 PCP: Hali Marry, MD  Time spent: 65 minutes 3-405pm  Guardian/Payee:  self    Paperwork requested: No   Reason for Visit /Presenting Problem: The patient arrived on time for her session. The patient reports she has done some marriage counseling and some individual through their marital work. She has done a lot of introspection. She was going to do it (counseling) because she is really sad over there mother's death. She has a pretty healthy view on death and that when old people died it's okay. Patient questions if she is processing it (her mother's death) "right".  Her mom was her "processing board". They did a lot of ministry together and she does not like the strucutre of the church, she prefers the relationship with people. Her mother was the middle ground between her, her spouse, and the church. The program stance of the church is frustrating for the patient and she has to choose to not share or she has to fight. Her whole stance is fighting for people. She is now charged with helping with her maternal grandmother, her father who is a "sickly diabetic", and her family. The patient loves what she does and would not want to quit, she would like to work Monday, Wednesday, and Friday. She helps her husband on the Thursdays she is not at work. She is very active with her children. She is losing her medical assistant JJ due to management issues and is having to adapt to losing her friend.  Patient is tired of fighting and wants to win. She is very challenging and strong and can be seen as devisive. Example of lady who took over mom's position who said she received an anointing form her mother. The patient was angry and confronted it through the leadership. The patient's spouse Kimberly Knight is Environmental consultant at [employer]. No particular fights over "religion". Main campus  has become extremely charasmatic, boasting, excentric, excessive worship, spiritualizing everything, speaking in tongues, and performing miracles.  Mental Status Exam: Appearance:   Casual     Behavior:  Appropriate and Sharing  Motor:  Normal  Speech/Language:   Clear and Coherent  Affect:  Appropriate  Mood:  normal  Thought process:  goal directed  Thought content:    WNL  Sensory/Perceptual disturbances:    WNL  Orientation:  oriented to person, place, time/date, and situation  Attention:  Good  Concentration:  Good  Memory:  WNL  Fund of knowledge:   Good  Insight:    Good  Judgment:   Good  Impulse Control:  Good   Risk Assessment: Danger to Self:  No Self-injurious Behavior: No Danger to Others: No Duty to Warn:no Physical Aggression / Violence:No  Access to Firearms a concern: No  Gang Involvement:No  Patient / guardian was educated about steps to take if suicide or homicide risk level increases between visits: n/a While future psychiatric events cannot be accurately predicted, the patient does not currently require acute inpatient psychiatric care and does not currently meet [facility] involuntary commitment criteria.  Substance Abuse History: Current substance abuse: No     Past Psychiatric History:   Previous psychological history is significant for marital Outpatient Providers: marital therapy, individual session to learn more about pt's marital concerns History of Psych Hospitalization: No  Psychological Testing:  none    Abuse History:  Victim of: No.   Report needed: No. Victim  of Neglect:No. Perpetrator of No.  Witness / Exposure to Domestic Violence: No   Protective Services Involvement: No  Witness to [employer] Violence:  No   Family History:  Family History  Problem Relation Age of Onset   Cancer Mother        breast, skin   Breast cancer Mother 13       57   Cancer Father        Pancreatic    Hypertension Father    Breast cancer  Maternal Grandmother     Living situation: the patient lives with their family  Sexual Orientation: Straight  Relationship Status: married  Name of spouse / other: Kimberly Knight age 71 If a parent, number of children / ages: two daughters, Kimberly Knight 78 and Kimberly Knight 7 and son Kimberly Milian "Zaya" 5  Support Systems: spouse friends parents  Museum/gallery curator Stress:  No   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  Military Service: No   Educational History: Education: post Forensic psychologist work or degree  Religion/Sprituality/World View: [employer], active in church  Any cultural differences that may affect / interfere with treatment:  not applicable   Recreation/Hobbies: church activities, time with family and friends  Stressors: Loss of mother who was pt's best friend in November 2024.   Other: caring for father and maternal grandmother, growing pains related to church environment and profound impact mother's death has had at church    Strengths: Supportive Relationships, Family, Friends, [place], [employer], Spirituality, Hopefulness, Conservator, museum/gallery, and Able to Communicate Effectively  Barriers:  none   Legal History: Pending legal issue / charges: The patient has no significant history of legal issues. History of legal issue / charges: none  Medical History/Surgical History: reviewed Past Medical History:  Diagnosis Date   Disc degeneration, lumbosacral    Low blood pressure    when sick, body under stress   Radicular pain     Past Surgical History:  Procedure Laterality Date   ABDOMINAL EXPOSURE N/A 06/22/2019   Procedure: ABDOMINAL EXPOSURE;  Surgeon: Rosetta Posner, MD;  Location: [facility];  Service: Vascular;  Laterality: N/A;   ANTERIOR LUMBAR FUSION N/A 06/22/2019   Procedure: LUMBAR FIVE-SACRAL ONE ANTERIOR LUMBAR INTERBODY FUSION;  Surgeon: Eustace Moore, MD;  Location: [facility];  Service: Neurosurgery;   Laterality: N/A;   hemangioma removal 1995     TONSILLECTOMY     child    Medications: Current Outpatient Medications  Medication Sig Dispense Refill   DULoxetine (CYMBALTA) 30 MG capsule Take 1 capsule (30 mg total) by mouth daily. 90 capsule 1   No current facility-administered medications for this visit.    No Known Allergies  Diagnoses:  No diagnosis found.  Plan of Care:  -meet monthly with next session scheduled for Thursday, June 14, 2022 at 10am

## 2022-05-10 NOTE — Progress Notes (Signed)
° ° ° ° ° ° ° ° ° ° ° ° ° ° °  Glynn Freas, LCMHC °

## 2022-06-14 ENCOUNTER — Encounter: Payer: Self-pay | Admitting: Professional

## 2022-06-14 ENCOUNTER — Ambulatory Visit (INDEPENDENT_AMBULATORY_CARE_PROVIDER_SITE_OTHER): Payer: 59 | Admitting: Professional

## 2022-06-14 DIAGNOSIS — F4320 Adjustment disorder, unspecified: Secondary | ICD-10-CM | POA: Diagnosis not present

## 2022-06-14 NOTE — Progress Notes (Signed)
[facility] Counselor/Therapist Progress Note  Patient ID: Kimberly Knight, MRN: [id number],    Date: 06/19/2022  Time Spent: 60 minutes 1003- 1103am  Treatment Type: Individual Therapy  Risk Assessment: Danger to Self:  No Self-injurious Behavior: No Danger to Others: No  Subjective: The patient arrived late for her in-person appointment  Issues addressed: 1-grieving a-pt has lost her mother b-she found out this morning that she is likely going to lose the beach house that is symbolic as this was her mother and her happy place c-pt is also grieving the loss of her medical assistant d-she is feeling overwhelmed by the amount of responsibility she has at work and in [employer] e-she is feeling dissatisfied with how things are going at church f-she is questioning why God is putting her through this season -she is not angry with God 2-treatment planning -patient and Clinician completed treatment planning -pt participated in treatment planning and agrees with her plan  Treatment Plan Problems Addressed  Grief / Loss Unresolved  Goals 1. Begin a healthy grieving process around the loss. Objective Engage in relationships that are fulfilling. Target Date: 2022-09-17 Frequency: Daily  Progress: 0 Modality: individual  2. Complete the process of letting go of the lost significant other. Objective Implement acts of spiritual faith as a source of comfort and hope. Target Date: 2022-06-19 Frequency: Daily  Progress: 0 Modality: individual  Related Interventions Encourage the client to rely upon his/her spiritual faith promises, activities (e.g., prayer, meditation, worship, music), and fellowship as sources of support. Objective Begin verbalizing feelings associated with the loss. Target Date: 2022-06-19 Frequency: Daily  Progress: 0 Modality: individual  Related Interventions Assign the client to keep a daily grief journal to be shared in therapy sessions. Ask  the client to bring pictures or mementos connected with his/her loss to a session and talk about them (or assign "Creating a [employer]" in the Adult Psychotherapy Homework Planner by Stephannie Li). Assist the client in identifying and expressing feelings connected with his/her loss. Objective Acknowledge dependency on lost loved one and begin to refocus life on independent actions to meet emotional needs. Target Date: 2022-06-19 Frequency: Daily  Progress: 0 Modality: individual  Related Interventions Assist the client in identifying how he/she depended upon the significant other, expressing and resolving the accompanying feelings of abandonment and of being left alone. Objective Verbalize resolution of feelings of guilt and regret associated with the loss. Target Date: 2022-06-19 Frequency: Daily  Progress: 0 Modality: individual  Related Interventions Assign the client to make a list of all the regrets associated with actions toward or relationship with the deceased; process the list content toward resolution of these feelings. Objective Reengage in activities with family, friends, coworkers, and others. Target Date: 2022-06-19 Frequency: Daily  Progress: 0 Modality: individual  Related Interventions Promote behavioral activation by assisting the client in listing activities which he/she previously enjoyed but has not engaged in since experiencing the loss and then encourage reengagement in these activities (or assign "Identify and Schedule Pleasant Activities" in the Adult Psychotherapy Homework Planner by Stephannie Li). 3. Establish supports within network that provide needed support. 4. Reimagine a fulfilling life that may not symbolically include her mother. 5. Resolve the loss, reengaging in old relationships and initiating new contacts with others.  Diagnosis:Adjustment disorder, unspecified type  Plan:  -pt to schedule appointment as needed.

## 2022-06-14 NOTE — Progress Notes (Signed)
° ° ° ° ° ° ° ° ° ° ° ° ° ° °  Averiana Clouatre, LCMHC °

## 2022-11-05 ENCOUNTER — Telehealth: Payer: Self-pay

## 2022-11-05 ENCOUNTER — Other Ambulatory Visit (HOSPITAL_BASED_OUTPATIENT_CLINIC_OR_DEPARTMENT_OTHER): Payer: Self-pay

## 2022-11-05 DIAGNOSIS — M5136 Other intervertebral disc degeneration, lumbar region: Secondary | ICD-10-CM

## 2022-11-05 MED ORDER — DULOXETINE HCL 30 MG PO CPEP
30.0000 mg | ORAL_CAPSULE | Freq: Every day | ORAL | 1 refills | Status: DC
  Filled 2022-11-05: qty 90, 90d supply, fill #0
  Filled 2023-02-13: qty 90, 90d supply, fill #1

## 2022-11-05 NOTE — Telephone Encounter (Signed)
Rx refill

## 2022-11-14 ENCOUNTER — Other Ambulatory Visit (HOSPITAL_BASED_OUTPATIENT_CLINIC_OR_DEPARTMENT_OTHER): Payer: Self-pay

## 2022-11-14 ENCOUNTER — Telehealth: Payer: Self-pay | Admitting: Family Medicine

## 2022-11-14 DIAGNOSIS — N200 Calculus of kidney: Secondary | ICD-10-CM

## 2022-11-14 MED ORDER — TAMSULOSIN HCL 0.4 MG PO CAPS
0.4000 mg | ORAL_CAPSULE | Freq: Every day | ORAL | 3 refills | Status: AC
  Filled 2022-11-14 – 2022-12-17 (×2): qty 30, 30d supply, fill #0

## 2022-11-14 MED ORDER — HYDROCODONE-ACETAMINOPHEN 5-325 MG PO TABS
1.0000 | ORAL_TABLET | Freq: Four times a day (QID) | ORAL | 0 refills | Status: DC | PRN
  Filled 2022-11-14 – 2022-12-17 (×2): qty 15, 4d supply, fill #0

## 2022-11-14 NOTE — Telephone Encounter (Signed)
Starting having right flank pain severe on Sat and then moved some on Sunday.  Yesterday passed a stone at end of day of pain and difficulty urinating all day.   Meds ordered this encounter  Medications   HYDROcodone-acetaminophen (NORCO/VICODIN) 5-325 MG tablet    Sig: Take 1 tablet by mouth every 6 (six) hours as needed for moderate pain.    Dispense:  15 tablet    Refill:  0   tamsulosin (FLOMAX) 0.4 MG CAPS capsule    Sig: Take 1 capsule (0.4 mg total) by mouth daily.    Dispense:  30 capsule    Refill:  3

## 2022-11-27 ENCOUNTER — Other Ambulatory Visit (HOSPITAL_BASED_OUTPATIENT_CLINIC_OR_DEPARTMENT_OTHER): Payer: Self-pay

## 2022-12-17 ENCOUNTER — Other Ambulatory Visit (HOSPITAL_BASED_OUTPATIENT_CLINIC_OR_DEPARTMENT_OTHER): Payer: Self-pay

## 2023-05-31 ENCOUNTER — Ambulatory Visit (INDEPENDENT_AMBULATORY_CARE_PROVIDER_SITE_OTHER): Admitting: Family Medicine

## 2023-05-31 ENCOUNTER — Encounter: Payer: Self-pay | Admitting: Family Medicine

## 2023-05-31 ENCOUNTER — Other Ambulatory Visit (HOSPITAL_BASED_OUTPATIENT_CLINIC_OR_DEPARTMENT_OTHER): Payer: Self-pay

## 2023-05-31 VITALS — BP 120/49 | HR 73 | Ht 74.0 in | Wt 223.0 lb

## 2023-05-31 DIAGNOSIS — M5136 Other intervertebral disc degeneration, lumbar region with discogenic back pain only: Secondary | ICD-10-CM

## 2023-05-31 DIAGNOSIS — R0981 Nasal congestion: Secondary | ICD-10-CM | POA: Diagnosis not present

## 2023-05-31 DIAGNOSIS — R0683 Snoring: Secondary | ICD-10-CM | POA: Insufficient documentation

## 2023-05-31 MED ORDER — DULOXETINE HCL 30 MG PO CPEP
30.0000 mg | ORAL_CAPSULE | Freq: Every day | ORAL | 3 refills | Status: AC
  Filled 2023-06-03 (×3): qty 90, 90d supply, fill #0
  Filled 2023-09-04: qty 90, 90d supply, fill #1
  Filled 2023-12-11: qty 90, 90d supply, fill #2
  Filled 2024-03-24: qty 90, 90d supply, fill #3

## 2023-05-31 MED ORDER — FLUTICASONE PROPIONATE 50 MCG/ACT NA SUSP
2.0000 | Freq: Every day | NASAL | 6 refills | Status: AC
  Filled 2023-05-31: qty 16, 30d supply, fill #0

## 2023-05-31 MED ORDER — DULOXETINE HCL 30 MG PO CPEP
30.0000 mg | ORAL_CAPSULE | Freq: Every day | ORAL | 1 refills | Status: DC
  Filled 2023-05-31: qty 90, 90d supply, fill #0

## 2023-05-31 NOTE — Assessment & Plan Note (Signed)
 Most likely secondary to chronic nasal congestion.  Recommend saline rinse and start fluticasone for couple weeks to see if it improves.  If not consider ENT referral.  She does have some more narrowing on the right nostril compared to the left.    Stop bang score 2

## 2023-05-31 NOTE — Progress Notes (Signed)
 Complete physical exam  Patient: Kimberly Knight   DOB: [birth year]   37 y.o. Female  MRN: [id number]  Subjective:    Chief Complaint  Patient presents with   Annual Exam    Pt would like to discuss her snoring video, she also has her labs put in for [place] to draw    Kimberly Knight is a 38 y.o. female who presents today for a complete physical exam. She reports consuming a general diet. Gym/ health club routine includes walking, weight lifting, trainer, and plays pickle ball.. She generally feels well. She reports sleeping fairly well. She does not have additional problems to discuss today.   She does report frequent snoring per her husband.  She does have a lot of chronic nasal congestion though.  She does not wake up feeling tired no morning headaches.  No daytime excessive sleepiness.   Most recent fall risk assessment:    10/17/2021    2:04 PM  Fall Risk   Falls in the past year? 0  Number falls in past yr: 0  Injury with Fall? 0  Risk for fall due to : No Fall Risks  Follow up Falls evaluation completed     Most recent depression screenings:    10/17/2021    2:04 PM 10/22/2018   12:09 PM  PHQ 2/9 Scores  PHQ - 2 Score 0 0         Patient Care Team: Agapito Games, MD as PCP - General (Family Medicine) Monica Becton, MD as Referring Physician (Neurosurgery) Arman Bogus, MD as Consulting Physician (Neurosurgery)   Outpatient Medications Prior to Visit  Medication Sig   HYDROcodone-acetaminophen (NORCO/VICODIN) 5-325 MG tablet Take 1 tablet by mouth every 6 (six) hours as needed for moderate pain.   tamsulosin (FLOMAX) 0.4 MG CAPS capsule Take 1 capsule (0.4 mg total) by mouth daily.   [DISCONTINUED] DULoxetine (CYMBALTA) 30 MG capsule Take 1 capsule (30 mg total) by mouth daily.   No facility-administered medications prior to visit.    ROS        Objective:     BP (!) 120/49 (BP Location: Left Arm, Patient Position: Sitting,  Cuff Size: Normal)   Pulse 73   Ht 6\' 2"  (1.88 m)   Wt 223 lb (101.2 kg)   SpO2 98%   BMI 28.63 kg/m     Physical Exam Constitutional:      Appearance: Normal appearance.  HENT:     Head: Normocephalic and atraumatic.     Right Ear: Tympanic membrane, ear canal and external ear normal.     Left Ear: Tympanic membrane, ear canal and external ear normal.     Nose: Nose normal.     Mouth/Throat:     Pharynx: Oropharynx is clear.  Eyes:     Extraocular Movements: Extraocular movements intact.     Conjunctiva/sclera: Conjunctivae normal.     Pupils: Pupils are equal, round, and reactive to light.  Neck:     Thyroid: No thyromegaly.  Cardiovascular:     Rate and Rhythm: Normal rate and regular rhythm.  Pulmonary:     Effort: Pulmonary effort is normal.     Breath sounds: Normal breath sounds.  Abdominal:     General: Bowel sounds are normal.     Palpations: Abdomen is soft.     Tenderness: There is no abdominal tenderness.  Musculoskeletal:        General: No swelling.     Cervical back:  Neck supple.  Skin:    General: Skin is warm and dry.  Neurological:     Mental Status: She is oriented to person, place, and time.  Psychiatric:        Mood and Affect: Mood normal.        Behavior: Behavior normal.      No results found for any visits on 05/31/23.      Assessment & Plan:    Routine Health Maintenance and Physical Exam  Immunization History  Administered Date(s) Administered   Influenza Whole 11/26/2012   Influenza,inj,Quad PF,6+ Mos 12/21/2019, 11/26/2020, 12/26/2021   Influenza,trivalent, recombinat, inj, PF 11/27/2022   Influenza-Unspecified 11/27/2014, 11/15/2016, 11/28/2017, 11/11/2018   Tdap 02/26/2010, 05/20/2014    Health Maintenance  Topic Date Due   Hepatitis C Screening  Never done   COVID-19 Vaccine (1 - 2024-25 season) Never done   INFLUENZA VACCINE  09/27/2023   DTaP/Tdap/Td (3 - Td or Tdap) 05/19/2024   Cervical Cancer Screening (HPV/Pap  Cotest)  02/17/2025   HIV Screening  Completed   HPV VACCINES  Aged Out    Discussed health benefits of physical activity, and encouraged her to engage in regular exercise appropriate for her age and condition.  Problem List Items Addressed This Visit       Other   Snoring - Primary   Most likely secondary to chronic nasal congestion.  Recommend saline rinse and start fluticasone for couple weeks to see if it improves.  If not consider ENT referral.  She does have some more narrowing on the right nostril compared to the left.    Stop bang score 2      Other Visit Diagnoses       Discogenic low back pain       Relevant Medications   DULoxetine (CYMBALTA) 30 MG capsule     Nasal congestion       Relevant Medications   fluticasone (FLONASE) 50 MCG/ACT nasal spray       Keep up a regular exercise program and make sure you are eating a healthy diet Try to eat 4 servings of dairy a day, or if you are lactose intolerant take a calcium with vitamin D daily.  Your vaccines are up to date.  She will get labs done through work.   No follow-ups on file.     Nani Gasser, MD

## 2023-06-03 ENCOUNTER — Other Ambulatory Visit (HOSPITAL_BASED_OUTPATIENT_CLINIC_OR_DEPARTMENT_OTHER): Payer: Self-pay

## 2023-06-10 LAB — LAB REPORT - SCANNED
A1c: 5.4
EGFR: 88

## 2023-08-01 DIAGNOSIS — H52223 Regular astigmatism, bilateral: Secondary | ICD-10-CM | POA: Diagnosis not present

## 2023-08-01 DIAGNOSIS — H5213 Myopia, bilateral: Secondary | ICD-10-CM | POA: Diagnosis not present

## 2023-08-01 DIAGNOSIS — Z135 Encounter for screening for eye and ear disorders: Secondary | ICD-10-CM | POA: Diagnosis not present

## 2023-08-14 ENCOUNTER — Other Ambulatory Visit (HOSPITAL_BASED_OUTPATIENT_CLINIC_OR_DEPARTMENT_OTHER): Payer: Self-pay

## 2023-08-14 ENCOUNTER — Encounter: Payer: Self-pay | Admitting: Family Medicine

## 2023-08-14 ENCOUNTER — Other Ambulatory Visit: Payer: Self-pay

## 2023-08-14 ENCOUNTER — Ambulatory Visit (INDEPENDENT_AMBULATORY_CARE_PROVIDER_SITE_OTHER): Admitting: Family Medicine

## 2023-08-14 DIAGNOSIS — N200 Calculus of kidney: Secondary | ICD-10-CM

## 2023-08-14 DIAGNOSIS — Z7184 Encounter for health counseling related to travel: Secondary | ICD-10-CM | POA: Diagnosis not present

## 2023-08-14 DIAGNOSIS — Z981 Arthrodesis status: Secondary | ICD-10-CM | POA: Diagnosis not present

## 2023-08-14 MED ORDER — TIZANIDINE HCL 4 MG PO TABS
4.0000 mg | ORAL_TABLET | Freq: Four times a day (QID) | ORAL | 4 refills | Status: AC | PRN
  Filled 2023-08-14: qty 60, 15d supply, fill #0

## 2023-08-14 MED ORDER — AZITHROMYCIN 250 MG PO TABS
ORAL_TABLET | ORAL | 0 refills | Status: AC
  Filled 2023-08-14: qty 6, 5d supply, fill #0

## 2023-08-14 MED ORDER — HYDROCODONE-ACETAMINOPHEN 5-325 MG PO TABS
1.0000 | ORAL_TABLET | Freq: Four times a day (QID) | ORAL | 0 refills | Status: AC | PRN
  Filled 2023-08-14: qty 15, 4d supply, fill #0

## 2023-08-14 MED ORDER — FLUCONAZOLE 150 MG PO TABS
150.0000 mg | ORAL_TABLET | Freq: Once | ORAL | 1 refills | Status: AC
Start: 2023-08-14 — End: 2023-08-15
  Filled 2023-08-14: qty 1, 1d supply, fill #0

## 2023-08-14 NOTE — Progress Notes (Signed)
   Established Patient Office Visit  Subjective  Patient ID: Kimberly Knight, female    DOB: [birth year]  Age: 38 y.o. MRN: [id number]  No chief complaint on file.   HPI Travel advice encounter. - she is going to [place] for travel thsi summer and whole like to discus having some meds on hand for trip.    Would like refill on the hydrocodone  and muscle relaxer for her back, since prolonged traveling exacerbates her sxs.    Needs something for yeast infection and travelers diarrhea.      ROS    Objective:     There were no vitals taken for this visit.   Physical Exam Vitals reviewed.  Constitutional:      Appearance: Normal appearance.  HENT:     Head: Normocephalic.  Pulmonary:     Effort: Pulmonary effort is normal.   Neurological:     Mental Status: She is alert and oriented to person, place, and time.   Psychiatric:        Mood and Affect: Mood normal.        Behavior: Behavior normal.      No results found for any visits on 08/14/23.    The ASCVD Risk score (Arnett DK, et al., 2019) failed to calculate for the following reasons:   The 2019 ASCVD risk score is only valid for ages 26 to 63    Assessment & Plan:   Problem List Items Addressed This Visit       Other   S/P lumbar fusion   Relevant Medications   HYDROcodone -acetaminophen  (NORCO/VICODIN) 5-325 MG tablet   tiZANidine (ZANAFLEX) 4 MG tablet   Other Visit Diagnoses       Travel advice encounter    -  Primary     Kidney stone       Relevant Medications   HYDROcodone -acetaminophen  (NORCO/VICODIN) 5-325 MG tablet      No additional vaccines needed.    No follow-ups on file.    Duaine German, MD

## 2023-08-26 ENCOUNTER — Other Ambulatory Visit (HOSPITAL_BASED_OUTPATIENT_CLINIC_OR_DEPARTMENT_OTHER): Payer: Self-pay

## 2023-10-31 ENCOUNTER — Encounter: Payer: Self-pay | Admitting: Sports Medicine

## 2024-03-13 ENCOUNTER — Telehealth: Payer: Self-pay | Admitting: Family Medicine

## 2024-03-13 MED ORDER — ONDANSETRON 4 MG PO TBDP: 4.0000 mg | ORAL_TABLET | Freq: Three times a day (TID) | ORAL | 1 refills | Status: AC | PRN

## 2024-03-13 NOTE — Telephone Encounter (Signed)
 N/V hit suddenly last night. Likely gatroenteritis.   Meds ordered this encounter  Medications   ondansetron  (ZOFRAN -ODT) 4 MG disintegrating tablet    Sig: Take 1 tablet (4 mg total) by mouth every 8 (eight) hours as needed for nausea or vomiting.    Dispense:  20 tablet    Refill:  1
